# Patient Record
Sex: Male | Born: 1935 | Race: White | Hispanic: No | Marital: Married | Smoking: Never smoker
Health system: Southern US, Community
[De-identification: ages and names within clinical notes are randomized; demographics above are authoritative.]

## PROBLEM LIST (undated history)

## (undated) DIAGNOSIS — I441 Atrioventricular block, second degree: Secondary | ICD-10-CM

## (undated) DIAGNOSIS — I5030 Unspecified diastolic (congestive) heart failure: Secondary | ICD-10-CM

## (undated) DIAGNOSIS — I714 Abdominal aortic aneurysm, without rupture, unspecified: Secondary | ICD-10-CM

## (undated) DIAGNOSIS — I4891 Unspecified atrial fibrillation: Secondary | ICD-10-CM

## (undated) DIAGNOSIS — I2510 Atherosclerotic heart disease of native coronary artery without angina pectoris: Secondary | ICD-10-CM

## (undated) DIAGNOSIS — I10 Essential (primary) hypertension: Secondary | ICD-10-CM

## (undated) DIAGNOSIS — G459 Transient cerebral ischemic attack, unspecified: Secondary | ICD-10-CM

## (undated) DIAGNOSIS — E785 Hyperlipidemia, unspecified: Secondary | ICD-10-CM

## (undated) HISTORY — DX: Essential (primary) hypertension: I10

## (undated) HISTORY — DX: Unspecified diastolic (congestive) heart failure: I50.30

## (undated) HISTORY — DX: Unspecified atrial fibrillation: I48.91

## (undated) HISTORY — DX: Hyperlipidemia, unspecified: E78.5

## (undated) HISTORY — DX: Atrioventricular block, second degree: I44.1

## (undated) HISTORY — DX: Abdominal aortic aneurysm, without rupture: I71.4

## (undated) HISTORY — DX: Atherosclerotic heart disease of native coronary artery without angina pectoris: I25.10

## (undated) HISTORY — DX: Abdominal aortic aneurysm, without rupture, unspecified: I71.40

## (undated) HISTORY — DX: Transient cerebral ischemic attack, unspecified: G45.9

## (undated) HISTORY — PX: PROSTATECTOMY: SHX69

---

## 1975-01-31 HISTORY — PX: CORONARY ARTERY BYPASS GRAFT: SHX141

## 2000-04-05 ENCOUNTER — Ambulatory Visit (HOSPITAL_COMMUNITY): Admission: RE | Admit: 2000-04-05 | Discharge: 2000-04-05 | Payer: Self-pay | Admitting: Cardiology

## 2000-04-05 ENCOUNTER — Encounter: Payer: Self-pay | Admitting: Cardiology

## 2004-04-19 ENCOUNTER — Ambulatory Visit: Payer: Self-pay | Admitting: *Deleted

## 2004-10-05 ENCOUNTER — Ambulatory Visit: Payer: Self-pay | Admitting: *Deleted

## 2004-11-14 ENCOUNTER — Ambulatory Visit: Payer: Self-pay

## 2005-04-04 ENCOUNTER — Ambulatory Visit: Payer: Self-pay | Admitting: *Deleted

## 2005-05-17 ENCOUNTER — Ambulatory Visit: Payer: Self-pay | Admitting: *Deleted

## 2005-05-25 ENCOUNTER — Ambulatory Visit: Payer: Self-pay | Admitting: *Deleted

## 2005-06-21 ENCOUNTER — Ambulatory Visit: Payer: Self-pay | Admitting: *Deleted

## 2005-11-16 ENCOUNTER — Ambulatory Visit: Payer: Self-pay

## 2005-12-12 ENCOUNTER — Ambulatory Visit: Payer: Self-pay | Admitting: *Deleted

## 2006-06-13 ENCOUNTER — Ambulatory Visit: Payer: Self-pay | Admitting: *Deleted

## 2006-10-23 ENCOUNTER — Ambulatory Visit: Payer: Self-pay | Admitting: Cardiology

## 2006-10-25 ENCOUNTER — Ambulatory Visit: Payer: Self-pay | Admitting: Cardiology

## 2006-11-14 ENCOUNTER — Ambulatory Visit: Payer: Self-pay

## 2007-04-04 ENCOUNTER — Ambulatory Visit: Payer: Self-pay | Admitting: Cardiology

## 2007-04-15 ENCOUNTER — Encounter: Payer: Self-pay | Admitting: Cardiology

## 2007-04-15 ENCOUNTER — Ambulatory Visit: Payer: Self-pay

## 2007-05-16 ENCOUNTER — Ambulatory Visit: Payer: Self-pay | Admitting: Cardiology

## 2007-05-16 LAB — CONVERTED CEMR LAB
ALT: 28 units/L (ref 0–53)
AST: 27 units/L (ref 0–37)
Albumin: 3.6 g/dL (ref 3.5–5.2)
Alkaline Phosphatase: 59 units/L (ref 39–117)
BUN: 15 mg/dL (ref 6–23)
Basophils Absolute: 0 10*3/uL (ref 0.0–0.1)
Basophils Relative: 0.2 % (ref 0.0–1.0)
Bilirubin, Direct: 0.1 mg/dL (ref 0.0–0.3)
CO2: 32 meq/L (ref 19–32)
Calcium: 9.9 mg/dL (ref 8.4–10.5)
Chloride: 97 meq/L (ref 96–112)
Cholesterol: 96 mg/dL (ref 0–200)
Creatinine, Ser: 0.8 mg/dL (ref 0.4–1.5)
Eosinophils Absolute: 0.2 10*3/uL (ref 0.0–0.7)
Eosinophils Relative: 2.7 % (ref 0.0–5.0)
GFR calc Af Amer: 119 mL/min
GFR calc non Af Amer: 99 mL/min
Glucose, Bld: 84 mg/dL (ref 70–99)
HCT: 36.9 % — ABNORMAL LOW (ref 39.0–52.0)
HDL: 37 mg/dL — ABNORMAL LOW (ref >39.0)
Hemoglobin: 12.3 g/dL — ABNORMAL LOW (ref 13.0–17.0)
LDL Cholesterol: 49 mg/dL (ref 0–99)
Lymphocytes Relative: 12.7 % (ref 12.0–46.0)
MCHC: 33.2 g/dL (ref 30.0–36.0)
MCV: 88.4 fL (ref 78.0–100.0)
Monocytes Absolute: 0.9 10*3/uL (ref 0.1–1.0)
Monocytes Relative: 10.6 % (ref 3.0–12.0)
Neutro Abs: 6.2 10*3/uL (ref 1.4–7.7)
Neutrophils Relative %: 73.8 % (ref 43.0–77.0)
Platelets: 398 10*3/uL (ref 150–400)
Potassium: 4 meq/L (ref 3.5–5.1)
RBC: 4.17 M/uL — ABNORMAL LOW (ref 4.22–5.81)
RDW: 13.3 % (ref 11.5–14.6)
Sodium: 132 meq/L — ABNORMAL LOW (ref 135–145)
TSH: 1.34 microintl units/mL (ref 0.35–5.50)
Total Bilirubin: 1.2 mg/dL (ref 0.3–1.2)
Total CHOL/HDL Ratio: 2.6
Total Protein: 6.1 g/dL (ref 6.0–8.3)
Triglycerides: 49 mg/dL (ref 0–149)
VLDL: 10 mg/dL (ref 0–40)
WBC: 8.4 10*3/uL (ref 4.5–10.5)

## 2007-10-31 ENCOUNTER — Ambulatory Visit: Payer: Self-pay | Admitting: Cardiology

## 2007-11-05 ENCOUNTER — Ambulatory Visit: Payer: Self-pay | Admitting: Cardiology

## 2007-11-05 ENCOUNTER — Ambulatory Visit: Payer: Self-pay

## 2007-11-05 LAB — CONVERTED CEMR LAB
ALT: 32 units/L (ref 0–53)
AST: 26 units/L (ref 0–37)
Albumin: 3.7 g/dL (ref 3.5–5.2)
Alkaline Phosphatase: 80 units/L (ref 39–117)
BUN: 11 mg/dL (ref 6–23)
Bilirubin, Direct: 0.3 mg/dL (ref 0.0–0.3)
CO2: 30 meq/L (ref 19–32)
Calcium: 9.4 mg/dL (ref 8.4–10.5)
Chloride: 96 meq/L (ref 96–112)
Cholesterol: 106 mg/dL (ref 0–200)
Creatinine, Ser: 0.8 mg/dL (ref 0.4–1.5)
GFR calc Af Amer: 119 mL/min
GFR calc non Af Amer: 99 mL/min
Glucose, Bld: 93 mg/dL (ref 70–99)
HDL: 43.7 mg/dL (ref >39.0)
LDL Cholesterol: 55 mg/dL (ref 0–99)
Potassium: 4.2 meq/L (ref 3.5–5.1)
Sodium: 133 meq/L — ABNORMAL LOW (ref 135–145)
Total Bilirubin: 1.3 mg/dL — ABNORMAL HIGH (ref 0.3–1.2)
Total CHOL/HDL Ratio: 2.4
Total Protein: 6.1 g/dL (ref 6.0–8.3)
Triglycerides: 39 mg/dL (ref 0–149)
VLDL: 8 mg/dL (ref 0–40)

## 2008-03-10 ENCOUNTER — Ambulatory Visit: Payer: Self-pay

## 2008-03-10 ENCOUNTER — Ambulatory Visit: Payer: Self-pay | Admitting: Cardiology

## 2008-03-10 ENCOUNTER — Encounter: Payer: Self-pay | Admitting: Cardiology

## 2008-03-10 LAB — CONVERTED CEMR LAB
ALT: 24 units/L (ref 0–53)
AST: 26 units/L (ref 0–37)
Albumin: 3.7 g/dL (ref 3.5–5.2)
Alkaline Phosphatase: 87 units/L (ref 39–117)
BUN: 14 mg/dL (ref 6–23)
Bilirubin, Direct: 0.1 mg/dL (ref 0.0–0.3)
CO2: 34 meq/L — ABNORMAL HIGH (ref 19–32)
Calcium: 9.5 mg/dL (ref 8.4–10.5)
Chloride: 97 meq/L (ref 96–112)
Cholesterol: 106 mg/dL (ref 0–200)
Creatinine, Ser: 0.7 mg/dL (ref 0.4–1.5)
GFR calc Af Amer: 139 mL/min
GFR calc non Af Amer: 115 mL/min
Glucose, Bld: 52 mg/dL — ABNORMAL LOW (ref 70–99)
HDL: 38.5 mg/dL — ABNORMAL LOW (ref >39.0)
LDL Cholesterol: 53 mg/dL (ref 0–99)
Potassium: 3.4 meq/L — ABNORMAL LOW (ref 3.5–5.1)
Pro B Natriuretic peptide (BNP): 305 pg/mL — ABNORMAL HIGH (ref 0.0–100.0)
Sodium: 136 meq/L (ref 135–145)
Total Bilirubin: 1.5 mg/dL — ABNORMAL HIGH (ref 0.3–1.2)
Total CHOL/HDL Ratio: 2.8
Total Protein: 6.6 g/dL (ref 6.0–8.3)
Triglycerides: 71 mg/dL (ref 0–149)
VLDL: 14 mg/dL (ref 0–40)

## 2008-03-30 ENCOUNTER — Ambulatory Visit: Payer: Self-pay | Admitting: Cardiology

## 2008-05-09 DIAGNOSIS — I714 Abdominal aortic aneurysm, without rupture: Secondary | ICD-10-CM

## 2008-05-09 DIAGNOSIS — I4891 Unspecified atrial fibrillation: Secondary | ICD-10-CM

## 2008-05-09 DIAGNOSIS — E7800 Pure hypercholesterolemia, unspecified: Secondary | ICD-10-CM

## 2008-05-09 DIAGNOSIS — I5032 Chronic diastolic (congestive) heart failure: Secondary | ICD-10-CM

## 2008-05-09 DIAGNOSIS — I2510 Atherosclerotic heart disease of native coronary artery without angina pectoris: Secondary | ICD-10-CM

## 2008-05-09 DIAGNOSIS — I10 Essential (primary) hypertension: Secondary | ICD-10-CM

## 2008-05-12 ENCOUNTER — Encounter: Payer: Self-pay | Admitting: Cardiology

## 2008-05-12 ENCOUNTER — Ambulatory Visit: Payer: Self-pay | Admitting: Cardiology

## 2008-05-12 LAB — CONVERTED CEMR LAB
BUN: 17 mg/dL (ref 6–23)
CO2: 32 meq/L (ref 19–32)
Calcium: 9.4 mg/dL (ref 8.4–10.5)
Chloride: 107 meq/L (ref 96–112)
Creatinine, Ser: 0.7 mg/dL (ref 0.4–1.5)
GFR calc non Af Amer: 114.63 mL/min (ref >60)
Glucose, Bld: 72 mg/dL (ref 70–99)
Potassium: 4 meq/L (ref 3.5–5.1)
Pro B Natriuretic peptide (BNP): 621 pg/mL — ABNORMAL HIGH (ref 0.0–100.0)
Sodium: 141 meq/L (ref 135–145)

## 2008-07-01 ENCOUNTER — Encounter: Payer: Self-pay | Admitting: Cardiology

## 2008-07-14 ENCOUNTER — Ambulatory Visit: Payer: Self-pay | Admitting: Cardiology

## 2008-10-26 ENCOUNTER — Encounter (INDEPENDENT_AMBULATORY_CARE_PROVIDER_SITE_OTHER): Payer: Self-pay | Admitting: *Deleted

## 2008-10-29 ENCOUNTER — Telehealth: Payer: Self-pay | Admitting: Cardiology

## 2008-11-11 ENCOUNTER — Ambulatory Visit: Payer: Self-pay | Admitting: Cardiology

## 2008-11-11 ENCOUNTER — Ambulatory Visit: Payer: Self-pay

## 2008-11-11 LAB — CONVERTED CEMR LAB
BUN: 28 mg/dL — ABNORMAL HIGH (ref 6–23)
CO2: 29 meq/L (ref 19–32)
Calcium: 9.9 mg/dL (ref 8.4–10.5)
Chloride: 100 meq/L (ref 96–112)
Creatinine, Ser: 0.9 mg/dL (ref 0.4–1.5)
GFR calc non Af Amer: 85.67 mL/min (ref >60)
Glucose, Bld: 92 mg/dL (ref 70–99)
Potassium: 4.8 meq/L (ref 3.5–5.1)
Sodium: 138 meq/L (ref 135–145)

## 2008-12-28 ENCOUNTER — Ambulatory Visit: Payer: Self-pay | Admitting: Cardiology

## 2008-12-28 DIAGNOSIS — J069 Acute upper respiratory infection, unspecified: Secondary | ICD-10-CM | POA: Insufficient documentation

## 2009-07-19 ENCOUNTER — Ambulatory Visit: Payer: Self-pay | Admitting: Cardiology

## 2009-08-09 ENCOUNTER — Encounter: Payer: Self-pay | Admitting: Cardiology

## 2009-08-23 ENCOUNTER — Telehealth: Payer: Self-pay | Admitting: Cardiology

## 2009-11-10 ENCOUNTER — Encounter: Payer: Self-pay | Admitting: Cardiology

## 2009-11-11 ENCOUNTER — Encounter: Payer: Self-pay | Admitting: Cardiology

## 2009-11-11 ENCOUNTER — Ambulatory Visit: Payer: Self-pay

## 2010-01-11 ENCOUNTER — Ambulatory Visit: Payer: Self-pay | Admitting: Cardiology

## 2010-01-11 ENCOUNTER — Encounter: Payer: Self-pay | Admitting: Cardiology

## 2010-03-03 NOTE — Assessment & Plan Note (Signed)
Summary: F6M/DM   Primary Provider:  dr. Maisie Fus in [place]  CC:  check up.  History of Present Illness: Philip Flores is a pleasant gentleman with history of coronary artery disease, status post coronary artery bypassing graft, small abdominal aortic aneurysm, and atrial fibrillation.  His most recent catheterization was in March of 2002.  This followed an abnormal Myoview which showed an ejection fraction of 53% and moderate-to-severe ischemia in the inferior and lateral walls from the apex to the base.  His catheterization at that time showed total occlusion of his native vessels.  The left internal mammary artery to the left anterior descending was patent, and the distal left anterior descending filled well.  The distal left anterior descending also filled a marginal branch and a posterior descending artery.  His ejection fraction was 50%.  Note, the saphenous vein graft to the  posterior descending artery and posterolateral was completely occluded, and the saphenous vein graft to the circumflex was completely occluded.  He has been treated medically. He also has previous Wenckebach on previous electrocardiogram. He has also had problems with volume excess in the past treated with Lasix.  An echocardiogram performed on March 10, 2008 revealed an  ejection fraction of 50-55%.  There was mild-to- moderate left atrial enlargement.  There was mild tricuspid regurgitation.  It was a technically difficult study. Abdominal ultrasound in October of 2011 revealed a 3.5 x 3.5 cm aneurysm and followup was recommended in one year.  I last saw him in June of 2011. Since then the patient denies any dyspnea on exertion, orthopnea, PND, pedal edema, palpitations, syncope or chest pain.  Current Medications (verified): 1)  Plavix 75 Mg Tabs (Clopidogrel Bisulfate) .... Take One Tablet By Mouth Daily 2)  Furosemide 20 Mg Tabs (Furosemide) .... Take One Tablet By Mouth Daily. 3)  Simvastatin 80 Mg Tabs (Simvastatin)  .... Take One Half Tablet By Mouth Daily At Bedtime 4)  Aspirin 81 Mg Tbec (Aspirin) .... Take One Tablet By Mouth Daily 5)  Citalopram Hydrobromide 40 Mg Tabs (Citalopram Hydrobromide) .... Take One Tablet By Mouth Daily 6)  Vitamin B-12 500 Mcg Tabs (Cyanocobalamin) .Marland Kitchen.. 1 Tab By Mouth Once Daily 7)  Lisinopril 40 Mg Tabs (Lisinopril) .... Take One Tablet By Mouth Daily 8)  Tylenol Ex St Arthritis Pain 500 Mg Tabs (Acetaminophen) .... As Needed 9)  Artificial Tears 1.4 % Soln (Polyvinyl Alcohol) .... As Directed 10)  Cetirizine Hcl 10 Mg Tabs (Cetirizine Hcl) .Marland Kitchen.. 1 Tab By Mouth Once Daily 11)  Zetia 10 Mg Tabs (Ezetimibe) .... Take One Tablet By Mouth Daily. 12)  Spironolactone 25 Mg Tabs (Spironolactone) .... Take One Tablet By Mouth Daily  Allergies: No Known Drug Allergies  Past History:  Past Medical History: Reviewed history from 12/28/2008 and no changes required. Hypertension Coronary artery disease Hyperlipidemia Abdominal aortic aneurysm H/O TIA Atrial fibrillation H/O Mobitz 1 second degree AV block diastolic congestive heart failure  Past Surgical History: Reviewed history from 05/09/2008 and no changes required. CABG (1977 with redo 1988) Prostatectomy  Social History: Reviewed history from 05/12/2008 and no changes required. Tobacco Use - No.  Alcohol Use - no Married   Review of Systems       no fevers or chills, productive cough, hemoptysis, dysphasia, odynophagia, melena, hematochezia, dysuria, hematuria, rash, seizure activity, orthopnea, PND, pedal edema, claudication. Remaining systems are negative.   Vital Signs:  Patient profile:   77 year old male Height:      74 inches Weight:  188 pounds BMI:     24.23 Pulse rate:   49 / minute Resp:     14 per minute BP sitting:   128 / 65  (right arm)  Vitals Entered By: Kem Parkinson (January 11, 2010 9:47 AM)  Physical Exam  General:  Well-developed well-nourished in no acute distress.    Skin is warm and dry.  HEENT is normal.  Neck is supple. No thyromegaly.  Chest is clear to auscultation with normal expansion.  Cardiovascular exam is irregular and bradycardic. Abdominal exam nontender or distended. No masses palpated. Extremities show no edema. neuro grossly intact    EKG  Procedure date:  01/11/2010  Findings:      Atrial fibrillation at a rate of 47.  Impression & Recommendations:  Problem # 1:  ABDOMINAL AORTIC ANEURYSM (ICD-441.4) Followup abdominal ultrasound October 2012.  Problem # 2:  ATRIAL FIBRILLATION (ICD-427.31) Continue aspirin and Plavix. Not a Coumadin candidate given history of falls. Rate controlled on no medications. His updated medication list for this problem includes:    Plavix 75 Mg Tabs (Clopidogrel bisulfate) .Marland Kitchen... Take one tablet by mouth daily    Aspirin 81 Mg Tbec (Aspirin) .Marland Kitchen... Take one tablet by mouth daily  Problem # 3:  ESSENTIAL HYPERTENSION, BENIGN (ICD-401.1) Blood pressure controlled on present medications. Will continue. Potassium and renal function monitored by primary care. The following medications were removed from the medication list:    Benicar 20 Mg Tabs (Olmesartan medoxomil) .Marland Kitchen... Take one tablet by mouth daily His updated medication list for this problem includes:    Furosemide 20 Mg Tabs (Furosemide) .Marland Kitchen... Take one tablet by mouth daily.    Aspirin 81 Mg Tbec (Aspirin) .Marland Kitchen... Take one tablet by mouth daily    Lisinopril 40 Mg Tabs (Lisinopril) .Marland Kitchen... Take one tablet by mouth daily    Spironolactone 25 Mg Tabs (Spironolactone) .Marland Kitchen... Take one tablet by mouth daily  Problem # 4:  HYPERCHOLESTEROLEMIA-PURE (ICD-272.0) Continue present medications. Lipids and liver monitored by primary care. His updated medication list for this problem includes:    Simvastatin 80 Mg Tabs (Simvastatin) .Marland Kitchen... Take one half tablet by mouth daily at bedtime    Zetia 10 Mg Tabs (Ezetimibe) .Marland Kitchen... Take one tablet by mouth  daily.  Problem # 5:  CORONARY ATHEROSCLEROSIS NATIVE CORONARY ARTERY (ICD-414.01) Continue aspirin, Plavix, ACE inhibitor and statin. Patient declines myoview. His updated medication list for this problem includes:    Plavix 75 Mg Tabs (Clopidogrel bisulfate) .Marland Kitchen... Take one tablet by mouth daily    Aspirin 81 Mg Tbec (Aspirin) .Marland Kitchen... Take one tablet by mouth daily    Lisinopril 40 Mg Tabs (Lisinopril) .Marland Kitchen... Take one tablet by mouth daily  Problem # 6:  CONGESTIVE HEART FAILURE UNSPECIFIED (ICD-428.0) Continue present medications. Euvolemic on examination. The following medications were removed from the medication list:    Benicar 20 Mg Tabs (Olmesartan medoxomil) .Marland Kitchen... Take one tablet by mouth daily His updated medication list for this problem includes:    Plavix 75 Mg Tabs (Clopidogrel bisulfate) .Marland Kitchen... Take one tablet by mouth daily    Furosemide 20 Mg Tabs (Furosemide) .Marland Kitchen... Take one tablet by mouth daily.    Aspirin 81 Mg Tbec (Aspirin) .Marland Kitchen... Take one tablet by mouth daily    Lisinopril 40 Mg Tabs (Lisinopril) .Marland Kitchen... Take one tablet by mouth daily    Spironolactone 25 Mg Tabs (Spironolactone) .Marland Kitchen... Take one tablet by mouth daily  Patient Instructions: 1)  Your physician wants you to follow-up in: 6  MONTHS  You will receive a reminder letter in the mail two months in advance. If you don't receive a letter, please call our office to schedule the follow-up appointment.

## 2010-03-03 NOTE — Progress Notes (Signed)
Summary: Questions about meds  Phone Note Call from Patient Call back at Home Phone [phone]   Caller: Patient Summary of Call: Pt request call quwestions about medications Initial call taken by: Judie Grieve,  August 23, 2009 9:33 AM  Follow-up for Phone Call        spoke with pt wife, he was seen by dr Jens Som on 07/19/09 and after reviewing his med list spironolactone 25mg  once daily was not given to [place]. she wants to make sure he needs to cont both that and furosemide. will foward for dr Jens Som review. Deliah Goody, RN  August 23, 2009 11:31 AM   Additional Follow-up for Phone Call Additional follow up Details #1::        continue both Ferman Hamming, MD, [facility]  August 23, 2009 2:46 PM  pt wife aware Deliah Goody, RN  August 23, 2009 3:21 PM

## 2010-03-03 NOTE — Miscellaneous (Signed)
Summary: Orders Update  Clinical Lists Changes  Orders: Added new Test order of Abdominal Aorta Duplex (Abd Aorta Duplex) - Signed 

## 2010-03-03 NOTE — Assessment & Plan Note (Signed)
Summary: 6 month rov/sl   Primary Provider:  dr. Maisie Fus in [place]  CC:  check up.  History of Present Illness: Philip Flores is a pleasant gentleman with history of coronary artery disease, status post coronary artery bypassing graft, small abdominal aortic aneurysm, and atrial fibrillation.  His most recent catheterization was in March of 2002.  This followed an abnormal Myoview which showed an ejection fraction of 53% and moderate-to-severe ischemia in the inferior and lateral walls from the apex to the base.  His catheterization at that time showed total occlusion of his native vessels.  The left internal mammary artery to the left anterior descending was patent, and the distal left anterior descending filled well.  The distal left anterior descending also filled a marginal branch and a posterior descending artery.  His ejection fraction was 50%.  Note, the saphenous vein graft to the  posterior descending artery and posterolateral was completely occluded, and the saphenous vein graft to the circumflex was completely occluded.  He has been treated medically.He also has previous Wenckebach on previous electrocardiogram. He has also had problems with volume excess in the past treated with Lasix.  An echocardiogram performed on March 10, 2008 revealed an  ejection fraction of 50-55%.  There was mild-to- moderate left atrial enlargement.  There was mild tricuspid regurgitation.  It was a technically difficult study. Abdominal ultrasound in October of 2010 revealed a 3.5 x 3.4 cm aneurysm and followup was recommended in one year.  I last saw him in Nov 2010. Since then the patient denies any dyspnea on exertion, orthopnea, PND, pedal edema, palpitations, syncope or chest pain.   Current Medications (verified): 1)  Plavix 75 Mg Tabs (Clopidogrel Bisulfate) .... Take One Tablet By Mouth Daily 2)  Benicar 20 Mg Tabs (Olmesartan Medoxomil) .... Take One Tablet By Mouth Daily 3)  Furosemide 20 Mg Tabs  (Furosemide) .... Take One Tablet By Mouth Daily. 4)  Simvastatin 80 Mg Tabs (Simvastatin) .... Take One Half Tablet By Mouth Daily At Bedtime 5)  Aspirin 81 Mg Tbec (Aspirin) .... Take One Tablet By Mouth Daily 6)  Zyrtec Allergy 10 Mg Tabs (Cetirizine Hcl) .... As Needed 7)  Citalopram Hydrobromide 40 Mg Tabs (Citalopram Hydrobromide) .... Take One Tablet By Mouth Daily 8)  Vitamin B-12 500 Mcg Tabs (Cyanocobalamin) .Marland Kitchen.. 1 Tab By Mouth Once Daily 9)  Potassium Chloride Crys Cr 20 Meq Cr-Tabs (Potassium Chloride Crys Cr) .... 1/2 Tab Two Times A Day 10)  Lisinopril 40 Mg Tabs (Lisinopril) .... Take One Tablet By Mouth Daily 11)  Tylenol Ex St Arthritis Pain 500 Mg Tabs (Acetaminophen) .... As Needed 12)  Artificial Tears 1.4 % Soln (Polyvinyl Alcohol) .... As Directed 13)  Cetirizine Hcl 10 Mg Tabs (Cetirizine Hcl) .Marland Kitchen.. 1 Tab By Mouth Once Daily  Allergies: No Known Drug Allergies  Past History:  Past Medical History: Reviewed history from 12/28/2008 and no changes required. Hypertension Coronary artery disease Hyperlipidemia Abdominal aortic aneurysm H/O TIA Atrial fibrillation H/O Mobitz 1 second degree AV block diastolic congestive heart failure  Past Surgical History: Reviewed history from 05/09/2008 and no changes required. CABG (1977 with redo 1988) Prostatectomy  Social History: Reviewed history from 05/12/2008 and no changes required. Tobacco Use - No.  Alcohol Use - no Married   Review of Systems       no fevers or chills, productive cough, hemoptysis, dysphasia, odynophagia, melena, hematochezia, dysuria, hematuria, rash, seizure activity, orthopnea, PND, pedal edema, claudication. Remaining systems are negative.  Vital Signs:  Patient profile:   77 year old male Height:      74 inches Weight:      184 pounds BMI:     23.71 Pulse rate:   51 / minute Resp:     12 per minute BP sitting:   115 / 70  (left arm)  Vitals Entered By: Kem Parkinson (July 19, 2009 9:49 AM)  Physical Exam  General:  Well-developed well-nourished in no acute distress.  Skin is warm and dry.  HEENT is normal.  Neck is supple. No thyromegaly.  Chest is clear to auscultation with normal expansion.  Cardiovascular exam is irregular.  Abdominal exam nontender or distended. No masses palpated. Extremities show no edema. neuro grossly intact    EKG  Procedure date:  07/19/2009  Findings:      Atrial fibrillation at a rate of 51. Axis normal. Minor nonspecific ST changes.  Impression & Recommendations:  Problem # 1:  ABDOMINAL AORTIC ANEURYSM (ICD-441.4) Followup abdominal ultrasound in October of 2011.  Problem # 2:  ATRIAL FIBRILLATION (ICD-427.31) Rate controlled on no medications. No Coumadin given history of recurrent falls. Continue aspirin and Plavix. His updated medication list for this problem includes:    Plavix 75 Mg Tabs (Clopidogrel bisulfate) .Marland Kitchen... Take one tablet by mouth daily    Aspirin 81 Mg Tbec (Aspirin) .Marland Kitchen... Take one tablet by mouth daily  Problem # 3:  ESSENTIAL HYPERTENSION, BENIGN (ICD-401.1) Blood pressure controlled on present medications. Will continue. Renal function and potassium monitor at the [place]. His updated medication list for this problem includes:    Benicar 20 Mg Tabs (Olmesartan medoxomil) .Marland Kitchen... Take one tablet by mouth daily    Furosemide 20 Mg Tabs (Furosemide) .Marland Kitchen... Take one tablet by mouth daily.    Aspirin 81 Mg Tbec (Aspirin) .Marland Kitchen... Take one tablet by mouth daily    Lisinopril 40 Mg Tabs (Lisinopril) .Marland Kitchen... Take one tablet by mouth daily  Problem # 4:  HYPERCHOLESTEROLEMIA-PURE (ICD-272.0) Continue present medications. Lipids and liver monitored at the [place]. The following medications were removed from the medication list:    Zetia 10 Mg Tabs (Ezetimibe) .Marland Kitchen... Take one tablet by mouth daily. His updated medication list for this problem includes:    Simvastatin 80 Mg Tabs (Simvastatin) .Marland Kitchen... Take one half  tablet by mouth daily at bedtime  Problem # 5:  CORONARY ATHEROSCLEROSIS NATIVE CORONARY ARTERY (ICD-414.01) Continue present medications including aspirin, ACE inhibitor and statin. We discussed a repeat Myoview but he would like to avoid this. Also his previous Myoview showed significant ischemia but followup catheterization suggested medical therapy only. His updated medication list for this problem includes:    Plavix 75 Mg Tabs (Clopidogrel bisulfate) .Marland Kitchen... Take one tablet by mouth daily    Aspirin 81 Mg Tbec (Aspirin) .Marland Kitchen... Take one tablet by mouth daily    Lisinopril 40 Mg Tabs (Lisinopril) .Marland Kitchen... Take one tablet by mouth daily  Problem # 6:  CONGESTIVE HEART FAILURE UNSPECIFIED (ICD-428.0) Euvolemic on examination. Continue present dose of diuretics. His updated medication list for this problem includes:    Plavix 75 Mg Tabs (Clopidogrel bisulfate) .Marland Kitchen... Take one tablet by mouth daily    Benicar 20 Mg Tabs (Olmesartan medoxomil) .Marland Kitchen... Take one tablet by mouth daily    Furosemide 20 Mg Tabs (Furosemide) .Marland Kitchen... Take one tablet by mouth daily.    Aspirin 81 Mg Tbec (Aspirin) .Marland Kitchen... Take one tablet by mouth daily    Lisinopril 40 Mg Tabs (Lisinopril) .Marland Kitchen... Take  one tablet by mouth daily  Patient Instructions: 1)  Your physician recommends that you schedule a follow-up appointment in: 6 MONTHS

## 2010-04-25 ENCOUNTER — Encounter: Payer: Self-pay | Admitting: Physician Assistant

## 2010-05-06 ENCOUNTER — Encounter: Payer: Self-pay | Admitting: Physician Assistant

## 2010-05-11 ENCOUNTER — Encounter: Payer: Self-pay | Admitting: Physician Assistant

## 2010-05-11 ENCOUNTER — Ambulatory Visit (INDEPENDENT_AMBULATORY_CARE_PROVIDER_SITE_OTHER): Payer: Medicare Other | Admitting: Physician Assistant

## 2010-05-11 VITALS — BP 129/66 | HR 53 | Ht 73.5 in | Wt 187.0 lb

## 2010-05-11 DIAGNOSIS — R0989 Other specified symptoms and signs involving the circulatory and respiratory systems: Secondary | ICD-10-CM

## 2010-05-11 DIAGNOSIS — R0609 Other forms of dyspnea: Secondary | ICD-10-CM

## 2010-05-11 DIAGNOSIS — I509 Heart failure, unspecified: Secondary | ICD-10-CM

## 2010-05-11 DIAGNOSIS — R5383 Other fatigue: Secondary | ICD-10-CM

## 2010-05-11 DIAGNOSIS — I5032 Chronic diastolic (congestive) heart failure: Secondary | ICD-10-CM

## 2010-05-11 DIAGNOSIS — I2510 Atherosclerotic heart disease of native coronary artery without angina pectoris: Secondary | ICD-10-CM

## 2010-05-11 DIAGNOSIS — I4891 Unspecified atrial fibrillation: Secondary | ICD-10-CM

## 2010-05-11 DIAGNOSIS — R5381 Other malaise: Secondary | ICD-10-CM

## 2010-05-11 NOTE — Assessment & Plan Note (Signed)
He denies any symptoms of angina.  Continue ASA and Plavix.  If he has recurrent symptoms and, otherwise, negative workup, we may need to consider functional testing (if he will agree).

## 2010-05-11 NOTE — Assessment & Plan Note (Addendum)
Etiology unclear.  His BNP was apparently elevated.  I do not have a copy of those results.  However currently he feels back to baseline.  With his history of diastolic heart failure, I will go ahead and obtain a repeat BNP as well as a basic metabolic panel.  Overall, his weight has been stable.  On exam, his volume appears stable.  I would have a high threshold to increase his Lasix.  Given his fatigue and significant coronary disease I will go ahead and repeat an echocardiogram to reassess his LV function.  He also has a history of type 1 second degree block and currently has chronic atrial fibrillation with a slow ventricular response.  He denies any history of syncope or near syncope.  However, I am concerned he may be at risk for significant sinus node dysfunction.  Therefore, I will set him up with a 48 hour Holter monitor to rule out significant bradycardia.  He will be brought back in follow up with Dr. Jens Som in the next 3-4 weeks.

## 2010-05-11 NOTE — Patient Instructions (Addendum)
Your physician recommends that you return for lab work in: TODAY BMET/BNP 428.32   Your physician has requested that you have an echocardiogram 780.79, 428.32. Echocardiography is a painless test that uses sound waves to create images of your heart. It provides your doctor with information about the size and shape of your heart and how well your heart's chambers and valves are working. This procedure takes approximately one hour. There are no restrictions for this procedure.  Your physician has recommended that you wear a 48 HOUR holter monitor 780.79, 428.32. Holter monitors are medical devices that record the heart's electrical activity. Doctors most often use these monitors to diagnose arrhythmias. Arrhythmias are problems with the speed or rhythm of the heartbeat. The monitor is a small, portable device. You can wear one while you do your normal daily activities. This is usually used to diagnose what is causing palpitations/syncope (passing out).

## 2010-05-11 NOTE — Assessment & Plan Note (Signed)
Slow ventricular response.  No coumadin due to h/o falls.  Get monitor to r/o significant bradycardia/pauses.

## 2010-05-11 NOTE — Progress Notes (Signed)
History of Present Illness: Primary Cardiologist: Dr. Lanae Crumbly Sr. is a 77 y.o. male with a history of coronary artery disease, status post coronary artery bypassing graft, small abdominal aortic aneurysm, and atrial fibrillation.  His most recent catheterization was in March of 2002.  This followed an abnormal Myoview which showed an ejection fraction of 53% and moderate-to-severe ischemia in the inferior and lateral walls from the apex to the base.  His catheterization at that time showed total occlusion of his native vessels.  The left internal mammary artery to the left anterior descending was patent, and the distal left anterior descending filled well.  The distal left anterior descending also filled a marginal branch and a posterior descending artery.  His ejection fraction was 50%.  Note, the saphenous vein graft to the  posterior descending artery and posterolateral was completely occluded, and the saphenous vein graft to the circumflex was completely occluded.  He has been treated medically. He also has previous Wenckebach on previous electrocardiogram. He has also had problems with volume excess in the past treated with Lasix.  An echocardiogram performed on March 10, 2008 revealed an  ejection fraction of 50-55%.  There was mild-to- moderate left atrial enlargement.  There was mild tricuspid regurgitation.  It was a technically difficult study. Abdominal ultrasound in October of 2011 revealed a 3.5 x 3.5 cm aneurysm and followup was recommended in one year.   He presents today at the request of his PCP.  He was seen 2 weeks ago with complaints of fatigue.  A comprehensive metabolic panel, CBC, TSH and BNP were all obtained.  I received a copy of the CMET which demonstrated a normal potassium at 4.7 and creatinine of 0.7.  Comments indicate that his BNP was elevated.  However, I do not have those results.  Apparently his hemoglobin was also low.  The patient really felt  exhausted with just about any type of work for about 2 weeks.  He feels better now.  He denies any chest discomfort.  He denies syncope or near-syncope.  He does have some chronic dyspnea with exertion.  He describes probable NYHA class II symptoms.  He denies orthopnea or PND.  He denies significant pedal edema.  As noted, since he saw his PCP, he has felt much better and actually feels back to baseline.  Past Medical History  Diagnosis Date  . Hypertension   . Coronary artery disease     a. s/p redo CABG 1988; cath 3/02: LAD, CFX and RCA occluded; S-PDA + PLBr of RCA + dCFX occluded, S-CFX occluded, L-LAD ok - treated medically  . Hyperlipidemia   . Abdominal aortic aneurysm     u/s 10/11: 3.5 x 3.5 cm  . TIA (transient ischemic attack)     hx of  . Atrial fibrillation     no coumadin 2/2 falls  . Mobitz type 1 second degree AV block   . Diastolic congestive heart failure     echo 2/10: Ef 50-55%, in AK, sept HK, mild Asc aorta dilataion, mild-mod BAE, mild LVH    Current Outpatient Prescriptions  Medication Sig Dispense Refill  . Acetaminophen (TYLENOL ARTHRITIS EXT RELIEF PO) Take by mouth as needed.        Marland Kitchen aspirin 81 MG tablet Take 81 mg by mouth 2 (two) times daily.       . Calcium Carbonate-Vitamin D (CALCIUM + D) 600-200 MG-UNIT TABS Take 1 tablet by mouth.        Marland Kitchen  cetirizine (ZYRTEC) 10 MG tablet Take 10 mg by mouth daily.        . citalopram (CELEXA) 40 MG tablet Take 40 mg by mouth daily.        . clopidogrel (PLAVIX) 75 MG tablet Take 75 mg by mouth daily.        Marland Kitchen ezetimibe (ZETIA) 10 MG tablet Take 10 mg by mouth daily.        . furosemide (LASIX) 20 MG tablet Take 20 mg by mouth daily.        Marland Kitchen lisinopril (PRINIVIL,ZESTRIL) 40 MG tablet Take 40 mg by mouth daily.        . polyvinyl alcohol (LIQUIFILM TEARS) 1.4 % ophthalmic solution As directed       . simvastatin (ZOCOR) 80 MG tablet Take 40 mg by mouth at bedtime.       Marland Kitchen spironolactone (ALDACTONE) 25 MG tablet  Take 12.5 mg by mouth daily.       . vitamin B-12 (CYANOCOBALAMIN) 500 MCG tablet Take 500 mcg by mouth daily.          No Known Allergies  History  Substance Use Topics  . Smoking status: Never Smoker   . Smokeless tobacco: Current User  . Alcohol Use: 0.6 oz/week    1 Glasses of wine per week    ROS:  Please see the history of present illness.  He denies fevers, chills.  He does have a chronic cough.  He denies melena or hematochezia.  All other systems reviewed and negative.  Vital Signs: BP 129/66  Pulse 53  Ht 6' 1.5" (1.867 m)  Wt 187 lb (84.823 kg)  BMI 24.34 kg/m2  PHYSICAL EXAM: Well nourished, well developed, in no acute distress HEENT: normal Neck: no JVD At 45 Endocrine: No thyromegaly Vascular: No carotid bruits Cardiac:  normal S1, S2; Irregularly irregular rhythm; no murmur Lungs:  clear to auscultation bilaterally, no wheezing, rhonchi or rales Abd: soft, nontender, no hepatomegaly Ext: Trace bilateral edema Skin: warm and dry Neuro:  CNs 2-12 intact, no focal abnormalities noted Psych: Normal affect  EKG:  Afib, HR 53, PVC, NSSTTW changes.  ASSESSMENT AND PLAN:

## 2010-05-11 NOTE — Assessment & Plan Note (Signed)
Check bnp and adjust lasix if needed.  Check repeat echo.

## 2010-05-12 LAB — BASIC METABOLIC PANEL
BUN: 24 mg/dL — ABNORMAL HIGH (ref 6–23)
CO2: 26 mEq/L (ref 19–32)
Calcium: 9.6 mg/dL (ref 8.4–10.5)
Creatinine, Ser: 0.8 mg/dL (ref 0.4–1.5)
GFR: 102.19 mL/min (ref >60.00)
Glucose, Bld: 118 mg/dL — ABNORMAL HIGH (ref 70–99)

## 2010-05-14 ENCOUNTER — Telehealth: Payer: Self-pay | Admitting: *Deleted

## 2010-05-14 DIAGNOSIS — R5381 Other malaise: Secondary | ICD-10-CM

## 2010-05-14 DIAGNOSIS — I5032 Chronic diastolic (congestive) heart failure: Secondary | ICD-10-CM

## 2010-05-14 DIAGNOSIS — I509 Heart failure, unspecified: Secondary | ICD-10-CM

## 2010-05-14 DIAGNOSIS — R5383 Other fatigue: Secondary | ICD-10-CM

## 2010-05-14 NOTE — Telephone Encounter (Signed)
Lab orders put in for repeat bmet/bnp 05/30/10

## 2010-05-14 NOTE — Telephone Encounter (Signed)
See phone note

## 2010-05-20 ENCOUNTER — Encounter (INDEPENDENT_AMBULATORY_CARE_PROVIDER_SITE_OTHER): Payer: Medicare Other

## 2010-05-20 ENCOUNTER — Ambulatory Visit (HOSPITAL_COMMUNITY): Payer: Medicare Other | Attending: Cardiology | Admitting: Radiology

## 2010-05-20 DIAGNOSIS — I4891 Unspecified atrial fibrillation: Secondary | ICD-10-CM

## 2010-05-20 DIAGNOSIS — I5032 Chronic diastolic (congestive) heart failure: Secondary | ICD-10-CM

## 2010-05-20 DIAGNOSIS — I080 Rheumatic disorders of both mitral and aortic valves: Secondary | ICD-10-CM | POA: Insufficient documentation

## 2010-05-20 DIAGNOSIS — I379 Nonrheumatic pulmonary valve disorder, unspecified: Secondary | ICD-10-CM | POA: Insufficient documentation

## 2010-05-20 DIAGNOSIS — I509 Heart failure, unspecified: Secondary | ICD-10-CM

## 2010-05-20 DIAGNOSIS — I079 Rheumatic tricuspid valve disease, unspecified: Secondary | ICD-10-CM | POA: Insufficient documentation

## 2010-05-20 DIAGNOSIS — I714 Abdominal aortic aneurysm, without rupture, unspecified: Secondary | ICD-10-CM | POA: Insufficient documentation

## 2010-05-20 DIAGNOSIS — I2510 Atherosclerotic heart disease of native coronary artery without angina pectoris: Secondary | ICD-10-CM | POA: Insufficient documentation

## 2010-05-20 DIAGNOSIS — R5383 Other fatigue: Secondary | ICD-10-CM

## 2010-05-22 ENCOUNTER — Encounter: Payer: Self-pay | Admitting: Physician Assistant

## 2010-05-30 ENCOUNTER — Other Ambulatory Visit (INDEPENDENT_AMBULATORY_CARE_PROVIDER_SITE_OTHER): Payer: Medicare Other | Admitting: *Deleted

## 2010-05-30 DIAGNOSIS — I509 Heart failure, unspecified: Secondary | ICD-10-CM

## 2010-05-30 DIAGNOSIS — R5383 Other fatigue: Secondary | ICD-10-CM

## 2010-05-30 DIAGNOSIS — I5032 Chronic diastolic (congestive) heart failure: Secondary | ICD-10-CM

## 2010-05-30 DIAGNOSIS — R0989 Other specified symptoms and signs involving the circulatory and respiratory systems: Secondary | ICD-10-CM

## 2010-05-30 DIAGNOSIS — R0609 Other forms of dyspnea: Secondary | ICD-10-CM

## 2010-05-30 DIAGNOSIS — R5381 Other malaise: Secondary | ICD-10-CM

## 2010-05-30 LAB — BASIC METABOLIC PANEL
GFR: 91.17 mL/min (ref >60.00)
Glucose, Bld: 148 mg/dL — ABNORMAL HIGH (ref 70–99)
Potassium: 3.6 mEq/L (ref 3.5–5.1)
Sodium: 137 mEq/L (ref 135–145)

## 2010-06-02 ENCOUNTER — Encounter: Payer: Self-pay | Admitting: Cardiology

## 2010-06-07 ENCOUNTER — Encounter: Payer: Self-pay | Admitting: Cardiology

## 2010-06-07 ENCOUNTER — Ambulatory Visit (INDEPENDENT_AMBULATORY_CARE_PROVIDER_SITE_OTHER): Payer: Medicare Other | Admitting: Cardiology

## 2010-06-07 DIAGNOSIS — I509 Heart failure, unspecified: Secondary | ICD-10-CM

## 2010-06-07 DIAGNOSIS — I5032 Chronic diastolic (congestive) heart failure: Secondary | ICD-10-CM

## 2010-06-07 DIAGNOSIS — I10 Essential (primary) hypertension: Secondary | ICD-10-CM

## 2010-06-07 DIAGNOSIS — E7800 Pure hypercholesterolemia, unspecified: Secondary | ICD-10-CM

## 2010-06-07 DIAGNOSIS — I4891 Unspecified atrial fibrillation: Secondary | ICD-10-CM

## 2010-06-07 DIAGNOSIS — I714 Abdominal aortic aneurysm, without rupture: Secondary | ICD-10-CM

## 2010-06-07 DIAGNOSIS — I2510 Atherosclerotic heart disease of native coronary artery without angina pectoris: Secondary | ICD-10-CM

## 2010-06-07 NOTE — Assessment & Plan Note (Signed)
Continue statin. 

## 2010-06-07 NOTE — Patient Instructions (Signed)
Your physician recommends that you schedule a follow-up appointment in:  6 MONTHS WITH DR CRENSHAW   Your physician recommends that you continue on your current medications as directed. Please refer to the Current Medication list given to you today. 

## 2010-06-07 NOTE — Assessment & Plan Note (Signed)
Followup abdominal ultrasound October 2012.

## 2010-06-07 NOTE — Assessment & Plan Note (Signed)
Euvolemic on examination. Continue present dose of diuretics. 

## 2010-06-07 NOTE — Assessment & Plan Note (Signed)
Continue aspirin and statin. 

## 2010-06-07 NOTE — Assessment & Plan Note (Signed)
Blood pressure controlled with present medications. Will continue. 

## 2010-06-07 NOTE — Progress Notes (Signed)
HPI: Mr. Speir is a pleasant gentleman with history of coronary artery disease, status post coronary artery bypassing graft, small abdominal aortic aneurysm, and atrial fibrillation.  His most recent catheterization was in March of 2002.  This followed an abnormal Myoview which showed an ejection fraction of 53% and moderate-to-severe ischemia in the inferior and lateral walls from the apex to the base.  His catheterization at that time showed total occlusion of his native vessels.  The left internal mammary artery to the left anterior descending was patent, and the distal left anterior descending filled well.  The distal left anterior descending also filled a marginal branch and a posterior descending artery.  His ejection fraction was 50%.  Note, the saphenous vein graft to the  posterior descending artery and posterolateral was completely occluded, and the saphenous vein graft to the circumflex was completely occluded.  He has been treated medically. He also has previous Wenckebach on previous electrocardiogram. He has also had problems with volume excess in the past treated with Lasix.  Abdominal ultrasound in October of 2011 revealed a 3.5 x 3.5 cm aneurysm and followup was recommended in one year. Recently seen for complaints of fatigue. Echocardiogram  In April of 2012showed normal LV function, moderate biatrial enlargement, mild right ventricular enlargement, mild mitral regurgitation. Holter monitor showed atrial fibrillation with bradycardia. However the episodes of bradycardia typically occurred in the early morning hours. Since he was seen previously, the patient denies any dyspnea on exertion, orthopnea, PND, pedal edema, palpitations, syncope or chest pain. His fatigue has resolved. No history of presyncope.    Current Outpatient Prescriptions  Medication Sig Dispense Refill  . aspirin 81 MG tablet Take 81 mg by mouth daily.       . Calcium Carbonate-Vitamin D (CALCIUM + D) 600-200 MG-UNIT TABS  Take 1 tablet by mouth.        . cetirizine (ZYRTEC) 10 MG tablet Take 10 mg by mouth daily.        . citalopram (CELEXA) 40 MG tablet Take 40 mg by mouth daily.        . clopidogrel (PLAVIX) 75 MG tablet Take 75 mg by mouth daily.        Marland Kitchen ezetimibe (ZETIA) 10 MG tablet Take 10 mg by mouth daily.        . furosemide (LASIX) 20 MG tablet Take 20 mg by mouth daily.        Marland Kitchen lisinopril (PRINIVIL,ZESTRIL) 40 MG tablet Take 40 mg by mouth daily.        . polyvinyl alcohol (LIQUIFILM TEARS) 1.4 % ophthalmic solution As directed       . simvastatin (ZOCOR) 80 MG tablet Take 40 mg by mouth at bedtime.       Marland Kitchen spironolactone (ALDACTONE) 25 MG tablet Take 12.5 mg by mouth daily.       . vitamin B-12 (CYANOCOBALAMIN) 500 MCG tablet Take 500 mcg by mouth daily.        . Acetaminophen (TYLENOL ARTHRITIS EXT RELIEF PO) Take by mouth as needed.           Past Medical History  Diagnosis Date  . Hypertension   . Coronary artery disease     a. s/p redo CABG 1988; cath 3/02: LAD, CFX and RCA occluded; S-PDA + PLBr of RCA + dCFX occluded, S-CFX occluded, L-LAD ok - treated medically  . Hyperlipidemia   . Abdominal aortic aneurysm     u/s 10/11: 3.5 x 3.5 cm  . TIA (  transient ischemic attack)     hx of  . Atrial fibrillation     no coumadin 2/2 falls  . Mobitz type 1 second degree AV block   . Diastolic congestive heart failure     echo 2/10: Ef 50-55%, in AK, sept HK, mild Asc aorta dilataion, mild-mod BAE, mild LVH;   b. echo 4/12:  EF 60-65%, mild LVH, mod BAE, mild MR, PASP 45    Past Surgical History  Procedure Date  . Coronary artery bypass graft 1977    with redo 1988  . Prostatectomy     History   Social History  . Marital Status: Married    Spouse Name: N/A    Number of Children: N/A  . Years of Education: N/A   Occupational History  . Not on file.   Social History Main Topics  . Smoking status: Never Smoker   . Smokeless tobacco: Current User  . Alcohol Use: 0.6 oz/week     1 Glasses of wine per week  . Drug Use: No  . Sexually Active: Not on file   Other Topics Concern  . Not on file   Social History Narrative  . No narrative on file    ROS: no fevers or chills, productive cough, hemoptysis, dysphasia, odynophagia, melena, hematochezia, dysuria, hematuria, rash, seizure activity, orthopnea, PND, pedal edema, claudication. Remaining systems are negative.  Physical Exam: Well-developed well-nourished in no acute distress.  Skin is warm and dry.  HEENT is normal.  Neck is supple. No thyromegaly.  Chest is clear to auscultation with normal expansion.  Cardiovascular exam is irregular Abdominal exam nontender or distended. No masses palpated. Extremities show no edema. neuro grossly intact

## 2010-06-07 NOTE — Assessment & Plan Note (Signed)
Continue aspirin and Plavix. Not a Coumadin candidate given history of falls. Rate is controlled on no medications. Note recent Holter monitor showed bradycardia but predominantly when patient asleep. His fatigue has resolved. No further therapy at this time.

## 2010-06-14 NOTE — Assessment & Plan Note (Signed)
[facility]                            CARDIOLOGY OFFICE NOTE   Philip Flores, Philip Flores                        MRN:          [id number]  DATE:03/10/2008                            DOB:          24-Jan-1924    Philip Flores is a pleasant gentleman with a history of coronary artery  disease, status post coronary bypassing graft, small abdominal aortic  aneurysm, and atrial fibrillation.  His LV function has been preserved  previously.  Since I last saw him, he apparently has developed mild  dyspnea on exertion, orthopnea, and increased pedal edema.  There has  been no chest pain, palpitations, or syncope.  He was seen by Dr. Maisie Fus  and placed on Lasix and his symptoms have dramatically improved.  He now  falls back to normal.  He lost approximately 10 pounds.   PRESENT MEDICATIONS:  1. Zocor 80 mg p.o. daily.  2. Plavix 75 mg p.o. daily.  3. Cetirizine 10 mg p.o. daily.  4. Zetia 10 mg p.o. daily.  5. Aspirin 81 mg tablets 2 p.o. daily.  6. Citalopram.  7. Vitamin B12.  8. Lisinopril 40 mg p.o. daily.  9. Lasix 20 mg p.o. b.i.d.   PHYSICAL EXAMINATION:  VITAL SIGNS:  Blood pressure of 161/71.  His  pulse is 60.  He weighs 187 pounds.  HEENT:  Normal.  NECK:  Supple.  CHEST:  Clear.  CARDIOVASCULAR:  Irregular rhythm.  ABDOMEN:  No tenderness.  EXTREMITIES:  Trace edema.  There was mild erythema on the left lower  extremity.   Electrocardiogram shows atrial fibrillation at a rate of 60.  The axis  is normal.  There are nonspecific ST changes.   DIAGNOSES:  1. Edema/volume excess - Philip Flores appears to have developed some      degree of heart failure, which may be either related to systolic      failure or diastolic.  We will schedule him to have an      echocardiogram to requantify his left ventricular function.  He      does state that he is up most of the night urinating.  I have      decreased his Lasix to 20 mg p.o. daily.  If he starts to  gain      weight or become short of breath again, we will increase to 40 mg      p.o. daily.  I will check a BMET today to follow his potassium,      renal function as well as a BNP.  We will see him back in 6 weeks      to make sure that he is stable.  2. Atrial fibrillation - his rate is controlled with medications.  He      will continue with his aspirin and Plavix.  He is not a Coumadin      candidate, as he has had multiple falls in the past.  3. History of small abdominal aortic aneurysm - the patient had an      ultrasound performed in October 2009, that showed 3.2 x  3.1-cm      aneurysm.  He will need to follow up in October of this year.  4. Hypertension - his blood pressure is elevated today.  I have added      Norvasc 5 mg p.o. daily to assist with this.  5. Hyperlipidemia - He will continue on his statin.  6. History of Wenckebach on previous electrocardiogram.  7. History of transient ischemic attack.     Philip Flores Philip Som, MD, [facility]  Electronically Signed    BSC/MedQ  DD: 03/10/2008  DT: 03/10/2008  Job #: [id number]   cc:   Philip Flores

## 2010-06-14 NOTE — Assessment & Plan Note (Signed)
[facility]                            CARDIOLOGY OFFICE NOTE   [name], [name]                        MRN:          [id number]  DATE:04/08/2007                            DOB:          1923/06/06    This note is concerning Mr. Philip Flores.  I recently saw him in the  office and on routine follow-up electrocardiogram, he was found to be  either in coarse atrial fibrillation versus atrial flutter.  Note, he  was not having symptoms.  We did note at that time that he had had  several falls in the past year and felt he was not a Coumadin candidate.  However, if this was an ablatable atrial flutter, we felt that a short-  term course of Coumadin and ablation would be appropriate to reduce his  risk of thromboembolic event.  I did review his electrocardiogram with  Dr. Ladona Ridgel today.  He feels that it may be a flutter, but if so, may be  difficult to ablate and would have a high potential of recurrence.  Given that he is asymptomatic, he would recommend rate control.  Again  we feel that he is not a good candidate for Coumadin given his history  of falls.  We have therefore treated with an aspirin.     Madolyn Frieze Jens Som, MD, [facility]  Electronically Signed    BSC/MedQ  DD: 04/08/2007  DT: 04/09/2007  Job #: [id number]

## 2010-06-14 NOTE — Letter (Signed)
Jun 13, 2006    Konrad Felix, M.D.  [facility] [address] [facility]  [address], [address]  [place], [place] [address]   RE:  [employer], [employer]  MRN:  [id number]  /  DOB:  1924-01-12   Dear Nida Boatman:   It was pleasure to see this nice patient, Philip Flores, for follow-up  on Jun 13, 2006.   As you know, he is a very pleasant 77 year old white married male with a  long history of coronary artery disease, initially seen by me in 1977.  He had severe coronary artery disease at that time and underwent CABG at  [facility] by Dr. __________ .   Subsequently he had redo CABG in 1988 by Dr. Particia Lather.  A cardiac  catheterization in February 2002 revealed total occlusion of the native  vessels and total effusion saphenous vein graft to the posterior  descending and posterolateral branch to the RCA and distal circumflex.  The only patent graft was the left internal mammary artery anastomosed  to the LAD, this was functioning normally.  There were good  collateralizations to the OM and distal RCA.  EF was 50%.  Patient  remains asymptomatic and very active.  Most recent stress test February  2002 revealed markedly positive EKG and moderately severe redistribution  inferior lateral walls.  This had not changed from the year previously.   He has had no symptoms and had not had follow-up study.  A 2-D echo in  February 2003, revealed EF of 55-60%.   MEDICATIONS:  1. Simvastatin 80.  2. Lisinopril 40.  3. Plavix 75.  4. Cetirizine 10 mg daily.  5. Zetia 10.  6. Aspirin 162 mg daily.  7. Hydrochlorothiazide 12.5.  8. B12 daily.   PHYSICAL EXAMINATION:  GENERAL APPEARANCE:  Normal.  VITAL SIGNS:  Blood pressure 140/76, pulse 73 in normal sinus rhythm.  NECK:  JVP is not elevated.  Carotid pulses palpable and equal without  bruits.  LUNGS:  Clear.  CARDIOVASCULAR:  Normal.  ABDOMEN:  Unremarkable.  I should note that he does have a known  abdominal  aneurysm most recently performed November 16, 2005, measuring  3.2 x 3.2 cm.  EXTREMITIES:  Normal.   EKG reveals normal sinus rhythm, first degree AV block, minor  nonspecific ST changes.   I should note that the lipids have been well controlled, most recently  noted in November 2007.   IMPRESSION:  1. Coronary artery disease, 20 years post redo coronary artery bypass      grafting by Dr. Particia Lather with follow-up catheterization      February 2002, revealing only the left internal mammary artery      patent.  2. Abdominal aneurysm.  3. Hyperlipidemia, treated.  4. Hypertension, treated.   We plan to continue on the same therapy.  He should have follow-up  lipids and renal profile in your office and I will have him return here  in three or four months to see Dr. Olga Millers for follow-up as I  will be retiring in June.   It would probably be good to repeat echocardiogram or Myoview following  that.   Thank you for the opportunity to share in this very pleasant gentleman's  care.  Certainly I hope he will continue to get along well.    Sincerely,      E. Graceann Congress, MD, [facility]    EJL/MedQ  DD: 06/13/2006  DT: 06/13/2006  Job #: [id number]

## 2010-06-14 NOTE — Assessment & Plan Note (Signed)
[place] [facility]                            CARDIOLOGY OFFICE NOTE   FAIZON, CAPOZZI                        MRN:          [id number]  DATE:10/31/2007                            DOB:          02-15-1923    Mr. Chahal is a pleasant gentleman who has a history of coronary artery  disease status post coronary artery bypassing graft, history of small  abdominal aortic aneurysm and atrial fibrillation.  He also has a  history of previous Wenckebach on previous electrocardiogram.  Since I  last saw him, he is doing well from symptomatic standpoint.  He denies  any increased dyspnea, chest pain, palpitations or syncope.  There is no  pedal edema.  He still is unsteady on his feet at times and has fallen  in the past.   MEDICATIONS:  1. Zocor 80 mg p.o. daily.  2. Plavix 75 mg p.o. daily.  3. Cetirizine 10 mg p.o. daily.  4. Zetia 10 mg p.o. daily.  5. Aspirin 81 mg p.o. daily.  6. Citalopram 40 mg p.o. daily.  7. Hydrochlorothiazide 12.5 mg p.o. daily.  8. Vitamin B12.  9. Lisinopril 40 mg p.o. daily.   PHYSICAL EXAMINATION:  VITAL SIGNS:  Blood pressure of 158/73 and his  pulse is 52.  He weighs 189 pounds.  HEENT:  Normal.  NECK:  Supple.  CHEST:  Clear.  CARDIOVASCULAR:  Bradycardic rate.  The rhythm appears to be regular.  ABDOMEN:  No tenderness.  EXTREMITIES:  No edema.   His electrocardiogram shows probable atrial fibrillation with slow  ventricular response at 51.  The axis is normal.  There are nonspecific  ST changes.   DIAGNOSES:  1. Atrial fibrillation - The patient remains in atrial fibrillation      today.  His rate is not elevated and there is no atrioventricular      nodal blockade agents needed.  We will continue with his aspirin      and Plavix.  He is not a Coumadin candidate as he has been unsteady      on his feet and had multiple falls in the past.  2. History of small abdominal aortic aneurysm - He needs a followup  ultrasound, we will arrange this.  3. Hypertension - His blood pressure is mildly elevated today.      However, he states it typically runs in the 130/80 range.  We will      make no changes and he will continue to track this at home.  I will      check a BMET to follow his potassium and renal function.  4. Hyperlipidemia - We will check lipids and liver and adjust as      indicated.  5. History of Wenckebach on previous electrocardiogram.  6. History of transient ischemic attack.   He will see me back in 6 months.     Madolyn Frieze Jens Som, MD, [facility]  Electronically Signed    BSC/MedQ  DD: 10/31/2007  DT: 11/01/2007  Job #: [id number]   cc:   Konrad Felix

## 2010-06-14 NOTE — Assessment & Plan Note (Signed)
[facility]                            CARDIOLOGY OFFICE NOTE   KAISEN, ACKERS                        MRN:          [id number]  DATE:03/30/2008                            DOB:          02/23/1923    Mr. Droke is a pleasant gentleman with history of coronary artery  disease, status post coronary artery bypassing graft, small abdominal  aortic aneurysm, and atrial fibrillation.  He also has previous  Wenckebach on previous electrocardiogram.  I last saw him on March 10, 2008.  At that time, he developed recent volume excess and Lasix had  been added.  We did decrease his Lasix at that time to 20 mg p.o. daily.  We scheduled him to have an echocardiogram, which was performed on  March 10, 2008.  His ejection fraction was 50-55%.  There was mild-to-  moderate left atrial enlargement.  There was mild tricuspid  regurgitation.  It was a technically difficult study.  Note, we also  checked laboratories and his BNP was mildly elevated at 305.  During  that evaluation, his blood pressure was also elevated and I added  Norvasc 5 mg p.o. daily.  However, apparently he developed worsening  pedal edema.  He was seen by Marshell Garfinkel, who works with Dr. Maisie Fus.  The  Norvasc was discontinued as he felt that maybe contributing to his  edema.  Instead, he added Benicar 20 mg p.o. daily.  Since then, he  denies any dyspnea, chest pain, palpitations, or syncope.  However, he  is complaining of pedal edema.  There is also some pruritus by his  report.   PRESENT MEDICATIONS:  1. Zocor 80 mg p.o. daily.  2. Plavix 75 mg p.o. daily.  3. Cetirizine 10 mg p.o. daily.  4. Zetia 10 mg p.o. daily.  5. Aspirin 81 mg tabs 2 p.o. daily.  6. Citalopram 40 mg p.o. daily.  7. Vitamin B12.  8. Lisinopril 40 mg p.o. daily.  9. Benicar 20 mg p.o. daily.  10.Lasix 20 mg p.o. daily.   PHYSICAL EXAMINATION:  VITAL SIGNS:  Blood pressure of 138/64.  His  pulse is 56.  He  weighs 189 pounds.  HEENT:  Normal.  NECK:  Supple.  CHEST:  Clear.  CARDIOVASCULAR:  Irregular rhythm.  ABDOMEN:  No tenderness.  EXTREMITIES:  1+ edema.   DIAGNOSES:  1. Diastolic congestive heart failure - the patient continues to have      some edema.  I have asked him to increase his Lasix to 40 mg p.o.      daily.  This should help both with his diuretics and also his blood      pressure.  We will check a BMET in 3 days to follow his potassium      and renal function.  We will continue off the Norvasc as certainly      it could have contributed to his edema, although I think it is more      likely could be diastolic congestive heart failure as his symptoms      have not improved  off of the Norvasc.  I will see the patient back      in 6 weeks to make sure that he is continuing to improve and not      becoming volume overloaded.  2. Atrial fibrillation - his rate is controlled on no medications.  He      will continue on his aspirin and Plavix.  He is not a Coumadin      candidate as he had multiple falls in the past.  3. History of small abdominal aortic aneurysm - the patient will need      a followup ultrasound in October 2010.  4. Hypertension - his blood pressure is improved.  We will continue      his Benicar.  We are checking a BMET in 3 days.  5. Hyperlipidemia - we will continue his Zocor.  6. History of Wenckebach on previous electrocardiogram.  7. History of transient ischemic attack.     Madolyn Frieze Jens Som, MD, [facility]  Electronically Signed    BSC/MedQ  DD: 03/30/2008  DT: 03/31/2008  Job #: [id number]   cc:   Konrad Felix

## 2010-06-14 NOTE — Assessment & Plan Note (Signed)
[facility]                            CARDIOLOGY OFFICE NOTE   AWS, SHERE                        MRN:          [id number]  DATE:05/16/2007                            DOB:          1923/10/08    Mr. Arredondo is a gentleman who has a history coronary disease, status post  coronary bypassing graft, history of small abdominal aortic aneurysm and  recently diagnosed atrial flutter.  He also has a history of Wenckebach  on his electrocardiogram.  Since I last saw him, he denies any dyspnea,  chest pain, palpitations or syncope and there is no pedal edema.  He did  fall again, stating that he lost his balance.   MEDICATIONS:  1. Zocor 80 mg p.o. daily.  2. Plavix 75 mg p.o. daily  3. Cetirizine.  4. Zetia 10 mg p.o. daily.  5. Aspirin 81 mg p.o. day.  6. Citalopram/HCTZ 12.5 mg p.o. daily.  7. Vitamin B12.  8. Lisinopril 20 mg p.o. daily.  9. Augmentin.  10.Amoxicillin.   PHYSICAL EXAMINATION:  VITAL SIGNS: Today show a blood pressure of  109/60 and his pulse is 54.  He weighs 185 pounds.  HEENT:  Normal.  NECK:  Supple.  No bruits.  CHEST:  Clear.  CARDIOVASCULAR:  Reveals a regular rate and rhythm.  ABDOMEN:  Shows no tenderness.  EXTREMITIES:  No edema.   DIAGNOSES:  1. Atrial flutter - I did review the previous electrocardiogram with      Dr. Ladona Ridgel, and he felt that this was not flutter that could be      easily ablated.  We therefore, will not pursue this further.  We      will not add atrioventricular nodal blocking agents since his rate      is controlled and he has had Wenckebach in the past.  Also note,      that he is not a Coumadin candidate as he has multiple falls and is      unsteady on his feet.  He will, therefore continue on his aspirin      and Plavix.  I have discussed this we he and his son and they      understand this.  2. History of small abdominal aortic aneurysm - he will need follow-up      ultrasound in  November.  3. Hypertension - his blood pressure is adequately controlled on his      present medications.  I will check a BMET today.  4. He is complaining of fatigue.  We will check a TSH and a CBC today.  5. Hyperlipidemia - we will check lipids and liver, and he will      continue on statin and Zetia for now.  6. History of Wenckebach on electrocardiogram - there is no syncope      and no prolonged pauses on his monitor.  Will not pursue this      further at this point.  7. History of transient ischemic attack.   We will see him back in 6 months.     Arlys John  S. Jens Som, MD, [facility]  Electronically Signed    BSC/MedQ  DD: 05/16/2007  DT: 05/16/2007  Job #: [id number]   cc:   Konrad Felix

## 2010-06-14 NOTE — Assessment & Plan Note (Signed)
[facility]                            CARDIOLOGY OFFICE NOTE   THAYER, EMBLETON                        MRN:          [id number]  DATE:10/23/2006                            DOB:          [birth year]    Mr. Philip Flores is a very pleasant gentleman who has previously been followed  by Dr. Glennon Hamilton.  The patient underwent coronary artery bypass and  graft in 1977 and had redo in 1988.  His most recent catheterization was  in March of 2002.  This followed an abnormal Myoview which showed an  ejection fraction of 53% and moderate-to-severe ischemia in the inferior  and lateral walls from the apex to the base.  His catheterization at  that time showed total occlusion of his native vessels.  The left  internal mammary artery to the left anterior descending was patent, and  the distal left anterior descending filled well.  The distal left  anterior descending also filled a marginal branch and a posterior  descending artery.  His ejection fraction was 50%.  Note, the saphenous  vein graft to the  posterior descending artery and posterolateral was  completely occluded, and the saphenous vein graft to the circumflex was  completely occluded.  He has been treated medically.  He also has a  small abdominal aortic aneurysm, hypertension, and hyperlipidemia.  He  has also had a history of prostate cancer, and has had a previous  prostatectomy.  Since Dr. Gabriel Rung last saw him he is doing well.  He denies  any dyspnea on exertion, orthopnea, PND, pedal edema, palpitations,  presyncope, syncope, exertional chest pain.   MEDICATIONS:  1. Zocor 80 mg p.o. daily.  2. Plavix 75 mg p.o. daily.  3. Cetirizine 10 mg p.o. daily.  4. Zetia 10 mg p.o. daily.  5. Aspirin 81 mg p.o. daily.  6. Citalopram 40 mg p.o. daily.  7. Hydrochlorothiazide 12.5 mg p.o. daily.  8. B12.  9. Lisinopril 20 mg p.o. daily.  10.Mobic.  11.Muscle relaxer.   PHYSICAL EXAMINATION:  Blood pressure  of 124/71, and his pulse is 82.  He weighs 179 pounds.  His HEENT is significant for small abrasions following a recent fall.  His neck is supple and there are no bruits noted.  His chest is clear.  His cardiovascular exam reveals a regular rate and rhythm.  His abdominal exam shows no pulsatile masses, no bruits.  His extremities show no edema.   His electrocardiogram shows a sinus rhythm with Wenckebach.  There is a  prior inferior infarct, and nonspecific ST changes are noted.   DIAGNOSES:  1. Coronary artery disease, status post coronary artery bypass and      graft:  The patient has had no chest pain or shortness of breath.      We will plan to continue with medical therapy at this point.  He is      on aspirin, angiotensin-converting enzyme inhibitor, and statin.      Note, he is not on a beta blocker due to his conduction      abnormalities.  2.  History of a small abdominal aortic aneurysm:  We will plan to      repeat his ultrasound in October.  3. Hypertension:  His blood pressure is well controlled on his present      medications.  I will have his most recent BMET forwarded to [place] from      Dr. Maisie Fus' office.  4. Hyperlipidemia:  He will continue on a statin, and we will have his      most recent lipids and liver forwarded to [place] for our records.  Our      goal LDL would be less than 70 given his history of coronary      disease.  5. Wenckebach on electrocardiogram:  We will check a 24-hour Holter      monitor.  Note, there is no history of syncope.  6. History of TIA.   I will see him back in approximately 6 months.     Madolyn Frieze Philip Som, MD, [facility]  Electronically Signed    BSC/MedQ  DD: 10/23/2006  DT: 10/24/2006  Job #: [id number]   cc:   Konrad Felix

## 2010-06-14 NOTE — Assessment & Plan Note (Signed)
[facility]                            CARDIOLOGY OFFICE NOTE   CAMRON, MONDAY                        MRN:          [id number]  DATE:04/04/2007                            DOB:          06-17-1923    Mr. Holdren is a gentleman previously followed by Dr. Glennon Hamilton.  He has  history of coronary artery bypassing graft in 1977 and redo in 1988.  Please refer my previous note on October 23, 2006, for details of  that.  He also has a history of small abdominal aortic aneurysm,  hypertension and hyperlipidemia.  When I last saw him we did schedule a  repeat abdominal ultrasound to size his aneurysm.  It was 3.4 x 3.4 cm  and follow-up was recommended in 1 year.  He also had stable bilateral  common iliac arteries.  We also scheduled him to have a Holter monitor  as he was noted to have Wenckebach on his EKG.  He was found to have  sinus with first AV block, Wenckebach, PVCs and a rare couplet.  There  are no prolonged pauses.  Since I last saw him he denies any increased  dyspnea, chest pain, palpitations or syncope, and there is no pedal  edema.  Note, the patient has had three falls in the past year.  He  states he has merely lost his balance.  One episode occurred when he  fell in his bathroom and hit his head on the commode, requiring  stitches.   MEDICATIONS:  1. Zocor 80 mg p.o. daily.  2. Plavix 75 mg p.o. daily.  3. Cetirizine 10 mg p.o. daily.  4. Zetia 10 mg p.o. daily.  5. Aspirin 81 mg tablets two p.o. daily.  6. Citalopram 40 mg p.o. daily.  7. Hydrochlorothiazide 12.5 mg p.o. daily.  8. Vitamin B12.  9. Lisinopril 20 mg p.o. daily.   PHYSICAL EXAM:  Blood pressure of 118/60.  His pulse is 60.  He weighs  186 pounds.  HEENT:  Normal.  NECK:  Supple.  CHEST:  Clear.  CARDIOVASCULAR:  An irregular rhythm.  ABDOMEN:  No tenderness.  EXTREMITIES:  No edema.   His electrocardiogram shows probable atrial flutter at a rate of 56.  There is a prior inferior infarct.   DIAGNOSES:  1. New-onset atrial flutter.  The electrocardiogram appears to be      atrial flutter to me.  I will review this with one of our      electrophysiologists.  If this is indeed atrial flutter, then I      would recommend beginning Coumadin in anticipation of atrial      flutter ablation and short course of Coumadin.  We will check an      echocardiogram as well as a TSH.  Of note, if it appears to be      coarse atrial fibrillation, then we will most likely continue with      his present medications.  His rate is adequately controlled.  I      would be hesitant to keep him on long-term Coumadin  as he has had      three falls in the past 1 year.  I think the risk would most likely      outweigh the benefit although if he did not have falls, he would      certainly benefit given his increased embolic risk factors of      hypertension and age greater than 104.  We will see him back in 6      weeks to review the above information.  Of note, we did have long      discussions in the office concerning the risks and benefits of      Coumadin and the patient's wife and he agree that he should      continue with his aspirin and Plavix for now.  2. History of small abdominal aortic aneurysm.  We will plan to repeat      this in November 2009.  3. Hypertension.  His blood pressures adequately controlled on his      present medications.  I will have his most recent BMET forwarded to      [place] from Dr. Maurine Minister office.  4. Hyperlipidemia.  He will continue on his statin and Zetia and we      will have his most recent lipids and liver forwarded to [place] for our      records.  5. History of Wenckebach on electrocardiogram.  There were no      prolonged pauses on his recent monitor.  6. History of transient ischemic attack.   We will see him back in 6 weeks.  Once I review this with  electrophysiologists, if it indeed is an ablatable flutter, then we will   arrange that.     Madolyn Frieze Jens Som, MD, [facility]  Electronically Signed    BSC/MedQ  DD: 04/04/2007  DT: 04/04/2007  Job #: [id number]   cc:   Konrad Felix

## 2010-06-17 NOTE — Cardiovascular Report (Signed)
[facility]  Patient:    Philip Flores, Philip Flores                      MRN: [id number] Proc. Date: 04/05/00 Adm. Date:  [id number] Attending:  Lenoria Farrier CC:         Cecil Cranker, M.D. [facility]  Konrad Felix, M.D.  Cardiac Catheterization Lab   Cardiac Catheterization  CLINICAL HISTORY:  Mr. Philip Flores is 76 years old and has had to re-do bypass surgery in 1988.  He has known occlusion of his circumflex graft and the distal limb of the saphenous vein graft to the posterior descending and posterolateral branches of the right coronary artery and distal circumflex artery.  He recently had a positive Cardiolite scan but was followed medically and then developed some recurrent symptoms of neck pain, and arrangements were made for him to come in for evaluation with catheterization.  DESCRIPTION OF PROCEDURE:  The procedure was performed via the right femoral artery using an arterial sheath and 6-French preformed coronary catheters.  A front wall arterial puncture was performed.  Omnipaque contrast was used.  We used a JR4 for injection of the vein graft to the circumflex and a right bypass graft catheter for injection of the vein graft to the right coronary artery.  A LIMA catheter was used for injection of a LIMA graft.  A distal aortogram was performed to evaluate for possible aortic aneurysm.  RESULTS: 1. The left main coronary artery:  The left main coronary artery had a 40%    distal stenosis. 2. The left anterior descending artery:  The left anterior descending    artery was completely occluded in its origin. 3. The circumflex artery:  The circumflex artery gave rise to an intermedius    branch, a first marginal branch which was occluded, and a second marginal    branch, and then was completely occluded in its mid portion.  There was 80%    narrowing in the intermedius branch and 40% narrowing in the second    marginal branch. 4. The right  coronary artery:  The right coronary artery was completely    occluded proximally.  The distal right coronary artery filled via    collaterals from the proximal right coronary artery as did the distal    circumflex artery.  The last limb of the sequential vein graft to the    posterolateral and circumflex arteries was visualized, and the circumflex    and then the posterolateral branch filled.  The distal posterior descending    branch filled via collaterals from the LAD. 5. The saphenous vein graft to    the posterior descending, posterolateral branch of the right coronary    artery and distal circumflex artery was completely occluded at its origin.    The saphenous vein graft to the circumflex artery was completely occluded    at its origin. 6. The LIMA graft to the LAD was patent, and the distal LAD filled well with    no major obstruction.  The distal LAD also filled a marginal branch and a    posterior descending branch.  Left ventriculogram:  The left ventriculogram, performed in the RAO projection, showed hypokinesis of the inferobasal wall.  The overall wall motion was fairly good with an estimated ejection fraction of 50%.  Distal aortogram:  A distal aortogram was performed which showed irregularity in the distal aorta but no major aortoiliac obstruction and no significant renal  artery stenosis.  CONCLUSION:  Coronary artery disease status post re-do coronary bypass surgery in 1988.  The native circulation shows total occlusion of the LAD, total occlusion of the right coronary artery, and total occlusion of the circumflex artery after an intermedius and marginal branch.  The saphenous vein graft to the posterior descending and posterolateral branches of the right coronary artery and distal circumflex artery was completely occluded at its origin; the saphenous vein graft to the circumflex artery was completely occluded at its origin; and the LIMA graft to the LAD was patent and  functioned normally. There was inferobasal wall hypokinesis with overall good LV function.  RECOMMENDATIONS:  The patient has occluded the remaining limb of the vein graft to the right coronary artery.  He is fairly well collateralized, and the distal vessels are not great target vessels.  He also is 77 years old and has had a previous re-do bypass surgery.  I believe I would favor medical therapy since he is not very symptomatic at present.  I will review this with Dr. Corinda Gubler. DD:  04/05/00 TD:  04/05/00 Job: [id number]

## 2010-06-17 NOTE — Letter (Signed)
December 12, 2005    Konrad Felix, M.D.  [facility]  [address]  [place], [place] [address]   RE:  [name], [name]  MRN:  [id number]  /  DOB:  February 18, 1923   Dear Nida Boatman:   It was a pleasure to see your nice patient, Philip Flores, for followup on  November 13. 2007.   As you know, he is a very pleasant 77 year old white married male with a  long history of coronary artery disease initially seen by me in 1977. He had  initial CABG in 1977, re-do CABG in 1988. He has remained asymptomatic since  that time. The cardiac cath in 2002, revealed total occlusion of the native  vessels and total occlusion of the saphenous vein grafts to the post  descending and post lateral branch of the RCA and distal circ. The only  patent graft is the left mammary artery to the LAD, which functions  normally. There was collateralization to the OM, and distal RCA. EF was 50%.   The patient remains asymptomatic. He is on simvastatin 80, lisinopril 40,  Plavix 75, Certerizine 10, Zetia 10, aspirin 162, citalopram 40, HCTZ 12.5,  Vesicare 5, B12 daily.   Blood pressure is 145/80, pulse is 71, normal sinus rhythm.  GENERAL APPEARANCE: Normal. JVP is not elevated. Carotid pulse palpable  without bruits.  LUNGS:  Are clear.  CARDIAC: Normal.  ABDOMEN: Unremarkable with small incisional hernia in the left epigastrium.  EXTREMITIES: Reveal no edema.   EKG: Reveals normal sinus rhythm, first-degree AV block, PR interval of 368  and his old inferior myocardial infarction.   IMPRESSION:  Coronary artery disease with previous CABG in 1977 and 1988,  with total occlusion of the native vessels and saphenous vein grafts. The  LIMA is patent as of February 2002. He also has a stable abdominal aortic  aneurysm 3.25 x 3.2.   We plan to continue on the same therapy. I do think that the Zetia is  important as is Plavix. I will plan to see him back in 5-6 months or p.r.n.     Sincerely,      E. Graceann Congress, MD, [facility]  Electronically Signed    EJL/MedQ  DD: 12/12/2005  DT: 12/12/2005  Job #: [id number]

## 2010-08-25 ENCOUNTER — Telehealth: Payer: Self-pay | Admitting: Cardiology

## 2010-08-25 NOTE — Telephone Encounter (Signed)
Pt's wife requested pt see dr Jens Som today, feels "rotten', denies chest pain, sob, dizziness, lightheadedness, syncope, couldn't get any actual symptoms at all, wife knows dr Jens Som out

## 2010-08-25 NOTE — Telephone Encounter (Signed)
Spoke with pt wife, she states for about one week the pt has felt bad. He denies any chest pain or SOB. She had called his primary and they told her to call us. She states his back and neck are bothering him. The back pain is an old injury from the [employer]. She is aware dr Jens Som is out of the office today. She called back and dr Nida Boatman Maisie Fus is going to see him this afternoon. She will call back if needed Deliah Goody

## 2010-08-31 ENCOUNTER — Other Ambulatory Visit: Payer: Self-pay | Admitting: Cardiology

## 2010-08-31 MED ORDER — CLOPIDOGREL BISULFATE 75 MG PO TABS: 75.0000 mg | ORAL_TABLET | Freq: Every day | ORAL | Status: DC

## 2010-08-31 NOTE — Telephone Encounter (Signed)
Pt is out of medication and pharmacy has been trying to get since last Friday.

## 2010-09-05 ENCOUNTER — Other Ambulatory Visit: Payer: Self-pay | Admitting: Cardiology

## 2010-09-28 ENCOUNTER — Telehealth: Payer: Self-pay | Admitting: *Deleted

## 2010-09-28 NOTE — Telephone Encounter (Signed)
OK to dc plavix and hold ASA prior to procedure; resume ASA after. Philip Flores

## 2010-09-28 NOTE — Telephone Encounter (Signed)
Dois Davenport with dr butler's office aware Deliah Goody

## 2010-09-28 NOTE — Telephone Encounter (Signed)
Received a call from sandra with dr Charm Barges, the pt was referred to GI for a workup for iron deficient anemia. They would like cardiac clearance for the pt to stop either aspirin or plavix. They would prefer he only take one or the other. Will forward for dr Jens Som review Philip Flores

## 2010-09-29 ENCOUNTER — Telehealth: Payer: Self-pay | Admitting: Cardiology

## 2010-09-29 NOTE — Telephone Encounter (Signed)
Patients having blood pressure issues. Yesterday 94/44. Also discuss changing medication. +

## 2010-09-29 NOTE — Telephone Encounter (Signed)
Spoke with pt wife, charlie was seen yesterday by the GI doctor, they think he maybe bleeding. His bp was 94/44, the GI doctor told them to call to see if he needed to stop his bp med. They have not tracked his bp at home and the pt is having no symptoms of orthostasis. They will track his bp at home and let us know if it trends low and he is having any dizziness when standing Deliah Goody

## 2010-10-06 ENCOUNTER — Telehealth: Payer: Self-pay | Admitting: Cardiology

## 2010-10-06 NOTE — Telephone Encounter (Signed)
Pt's wife calling debra back with "[employer]" that she requested

## 2010-10-06 NOTE — Telephone Encounter (Signed)
Spoke with pt wife, I had talked with her last week about the pts bp running low. She called today to report his bp ranging from 100-138/51-67. These pressures look good and we will make no changes in his meds at this time. She will call back with problems Philip Flores

## 2010-10-07 ENCOUNTER — Encounter: Payer: Self-pay | Admitting: Cardiology

## 2010-10-10 ENCOUNTER — Ambulatory Visit (INDEPENDENT_AMBULATORY_CARE_PROVIDER_SITE_OTHER): Payer: Medicare Other | Admitting: Cardiology

## 2010-10-10 ENCOUNTER — Telehealth: Payer: Self-pay | Admitting: *Deleted

## 2010-10-10 ENCOUNTER — Encounter: Payer: Self-pay | Admitting: Cardiology

## 2010-10-10 ENCOUNTER — Telehealth: Payer: Self-pay | Admitting: Cardiology

## 2010-10-10 DIAGNOSIS — I4891 Unspecified atrial fibrillation: Secondary | ICD-10-CM

## 2010-10-10 DIAGNOSIS — I714 Abdominal aortic aneurysm, without rupture, unspecified: Secondary | ICD-10-CM

## 2010-10-10 DIAGNOSIS — I10 Essential (primary) hypertension: Secondary | ICD-10-CM

## 2010-10-10 DIAGNOSIS — E7800 Pure hypercholesterolemia, unspecified: Secondary | ICD-10-CM

## 2010-10-10 DIAGNOSIS — I5032 Chronic diastolic (congestive) heart failure: Secondary | ICD-10-CM

## 2010-10-10 DIAGNOSIS — E860 Dehydration: Secondary | ICD-10-CM

## 2010-10-10 DIAGNOSIS — I2510 Atherosclerotic heart disease of native coronary artery without angina pectoris: Secondary | ICD-10-CM

## 2010-10-10 NOTE — Assessment & Plan Note (Signed)
Continue present medications for blood pressure but discontinue diuretics.

## 2010-10-10 NOTE — Assessment & Plan Note (Signed)
Continue statin. 

## 2010-10-10 NOTE — Assessment & Plan Note (Signed)
Watch volume status off of diuretics.

## 2010-10-10 NOTE — Assessment & Plan Note (Signed)
Continue aspirin, Plavix and statin. 

## 2010-10-10 NOTE — Assessment & Plan Note (Addendum)
followup abdominal ultrasound October 2012.

## 2010-10-10 NOTE — Telephone Encounter (Signed)
Returned call spoke with misti/ grand daughter. Was seen in [place] ER, lethargic not been feeling well for 8 weeks and been seen by pcp several times. Bp range is 68/40 72/44 P50- c/o feeling lethargic and irreg HB. Misti has records from ER except TSH panel. Current bp is 94/44.

## 2010-10-10 NOTE — Patient Instructions (Signed)
HOLD LASIX HOLD ALDACTONE  BMET Lab work today  Follow up with Dr.Crenshaw in 1 week.

## 2010-10-10 NOTE — Assessment & Plan Note (Signed)
Patient recently complaining of increased fatigue. His BUN and creatinine were elevated compared to previous and I think he is most likely prerenal. He felt better with IV fluids. I will discontinue his Lasix and spironolactone. Check potassium and renal function. We may resume at lower dose in the future. We will see him back next week to see if his symptoms have improved and possibly resume lower dose.

## 2010-10-10 NOTE — Telephone Encounter (Signed)
Patient grand-daughter calling patient went to er on Friday night . Was told to call the office this am for irregular heart beat. Also be seen today if possible.

## 2010-10-10 NOTE — Telephone Encounter (Signed)
[place] er over weekend for lethargy/ irreg HB/ hypotension 94/44, 72/44 P50, er let him go home with request to see his cardiologist. Been seeing PCP for weeks but continues to feel week. App made today

## 2010-10-10 NOTE — Progress Notes (Signed)
HPI:Mr. Philip Flores is a pleasant gentleman with history of coronary artery disease, status post coronary artery bypassing graft, small abdominal aortic aneurysm, and atrial fibrillation. His most recent catheterization was in March of 2002. This followed an abnormal Myoview which showed an ejection fraction of 53% and moderate-to-severe ischemia in the inferior and lateral walls from the apex to the base. His catheterization at that time showed total occlusion of his native vessels. The left internal mammary artery to the left anterior descending was patent, and the distal left anterior descending filled well. The distal left anterior descending also filled a marginal branch and a posterior descending artery. His ejection fraction was 50%. Note, the saphenous vein graft to the posterior descending artery and posterolateral was completely occluded, and the saphenous vein graft to the circumflex was completely occluded. He has been treated medically. He also has previous Wenckebach on previous electrocardiogram. He has also had problems with volume excess in the past treated with Lasix. Abdominal ultrasound in October of 2011 revealed a 3.5 x 3.5 cm aneurysm and followup was recommended in one year. Recently seen for complaints of fatigue. Echocardiogram In April of 2012 showed normal LV function, moderate biatrial enlargement, mild right ventricular enlargement, mild mitral regurgitation. Holter monitor showed atrial fibrillation with bradycardia. However the episodes of bradycardia typically occurred in the early morning hours. I last saw him in May of 2012. Since then, the patient has been complaining of weakness for approximately 2 months. Mild dizziness with standing. No dyspnea, chest pain or syncope. He was seen at [facility] on September 7 with the above complaints. Electrocardiogram showed atrial fibrillation at a rate of 49. Hemoglobin 12.7. Chest x-ray showed no active lung disease. BUN and creatinine 50  and 1.45. Troponin negative. Pro BNP 1330 which was normal. He was given 1 L of IV fluids and his ACE inhibitor was decreased. He did feel much better.   Current Outpatient Prescriptions  Medication Sig Dispense Refill  . Acetaminophen (TYLENOL ARTHRITIS EXT RELIEF PO) Take by mouth as needed.        Marland Kitchen aspirin 81 MG tablet Take 81 mg by mouth 2 (two) times daily.       Marland Kitchen azelastine (ASTELIN) 137 MCG/SPRAY nasal spray Place 1 spray into the nose 2 (two) times daily. Use in each nostril as directed       . Calcium Carbonate-Vitamin D (CALCIUM + D) 600-200 MG-UNIT TABS Take 1 tablet by mouth.        . cetirizine (ZYRTEC) 10 MG tablet Take 10 mg by mouth daily.        . citalopram (CELEXA) 40 MG tablet Take 40 mg by mouth daily.        . clopidogrel (PLAVIX) 75 MG tablet Take 1 tablet (75 mg total) by mouth daily.  30 tablet  10  . Ferrous Sulfate (IRON) 325 (65 FE) MG TABS Take by mouth 2 (two) times daily.        . furosemide (LASIX) 20 MG tablet Take 40 mg by mouth daily.       Marland Kitchen lisinopril (PRINIVIL,ZESTRIL) 40 MG tablet Take 20 mg by mouth daily.       . Multiple Vitamins-Minerals (MULTI COMPLETE PO) Take by mouth daily.        . polyvinyl alcohol (LIQUIFILM TEARS) 1.4 % ophthalmic solution As directed       . simvastatin (ZOCOR) 80 MG tablet Take 40 mg by mouth at bedtime.       Marland Kitchen spironolactone (  ALDACTONE) 25 MG tablet Take 12.5 mg by mouth daily.       . vitamin B-12 (CYANOCOBALAMIN) 500 MCG tablet Take 500 mcg by mouth daily.        Marland Kitchen ZETIA 10 MG tablet TAKE ONE (1) TABLET EACH DAY  30 tablet  12     Past Medical History  Diagnosis Date  . Hypertension   . Coronary artery disease     a. s/p redo CABG 1988; cath 3/02: LAD, CFX and RCA occluded; S-PDA + PLBr of RCA + dCFX occluded, S-CFX occluded, L-LAD ok - treated medically  . Hyperlipidemia   . Abdominal aortic aneurysm     u/s 10/11: 3.5 x 3.5 cm  . TIA (transient ischemic attack)     hx of  . Atrial fibrillation     no  coumadin 2/2 falls  . Mobitz type 1 second degree AV block   . Diastolic congestive heart failure     echo 2/10: Ef 50-55%, in AK, sept HK, mild Asc aorta dilataion, mild-mod BAE, mild LVH;   b. echo 4/12:  EF 60-65%, mild LVH, mod BAE, mild MR, PASP 45    Past Surgical History  Procedure Date  . Coronary artery bypass graft 1977    with redo 1988  . Prostatectomy     History   Social History  . Marital Status: Married    Spouse Name: N/A    Number of Children: N/A  . Years of Education: N/A   Occupational History  . Not on file.   Social History Main Topics  . Smoking status: Never Smoker   . Smokeless tobacco: Current User  . Alcohol Use: 0.6 oz/week    1 Glasses of wine per week  . Drug Use: No  . Sexually Active: Not on file   Other Topics Concern  . Not on file   Social History Narrative  . No narrative on file    ROS: no fevers or chills, productive cough, hemoptysis, dysphasia, odynophagia, melena, hematochezia, dysuria, hematuria, rash, seizure activity, orthopnea, PND, pedal edema, claudication. Remaining systems are negative.  Physical Exam: Well-developed well-nourished in no acute distress.  Skin is warm and dry.  HEENT is normal.  Neck is supple. No thyromegaly.  Chest is clear to auscultation with normal expansion.  Cardiovascular exam is bradycardic and irregular. Abdominal exam nontender or distended. No masses palpated. Extremities show no edema. neuro grossly intact  ECG atrial fibrillation at a rate of 56. Nonspecific ST changes.

## 2010-10-10 NOTE — Assessment & Plan Note (Signed)
Continue aspirin and Plavix. Not a Coumadin candidate. Heart rate is mildly reduced but it is not clear to me this is causing symptoms. If holding his diuretics does not improve his fatigue we will reassess.

## 2010-10-11 ENCOUNTER — Other Ambulatory Visit: Payer: Self-pay | Admitting: *Deleted

## 2010-10-11 DIAGNOSIS — E875 Hyperkalemia: Secondary | ICD-10-CM

## 2010-10-11 LAB — BASIC METABOLIC PANEL
BUN: 39 mg/dL — ABNORMAL HIGH (ref 6–23)
CO2: 25 mEq/L (ref 19–32)
Chloride: 97 mEq/L (ref 96–112)
Creatinine, Ser: 1.1 mg/dL (ref 0.4–1.5)
Glucose, Bld: 63 mg/dL — ABNORMAL LOW (ref 70–99)
Potassium: 5.7 mEq/L — ABNORMAL HIGH (ref 3.5–5.1)

## 2010-10-12 ENCOUNTER — Other Ambulatory Visit (INDEPENDENT_AMBULATORY_CARE_PROVIDER_SITE_OTHER): Payer: Medicare Other | Admitting: *Deleted

## 2010-10-12 DIAGNOSIS — E875 Hyperkalemia: Secondary | ICD-10-CM

## 2010-10-12 LAB — BASIC METABOLIC PANEL
CO2: 26 mEq/L (ref 19–32)
Calcium: 10 mg/dL (ref 8.4–10.5)
Potassium: 5 mEq/L (ref 3.5–5.1)
Sodium: 135 mEq/L (ref 135–145)

## 2010-10-19 ENCOUNTER — Ambulatory Visit (INDEPENDENT_AMBULATORY_CARE_PROVIDER_SITE_OTHER): Payer: Medicare Other | Admitting: Cardiology

## 2010-10-19 ENCOUNTER — Encounter: Payer: Self-pay | Admitting: Cardiology

## 2010-10-19 DIAGNOSIS — I4891 Unspecified atrial fibrillation: Secondary | ICD-10-CM

## 2010-10-19 DIAGNOSIS — I2510 Atherosclerotic heart disease of native coronary artery without angina pectoris: Secondary | ICD-10-CM

## 2010-10-19 DIAGNOSIS — R5383 Other fatigue: Secondary | ICD-10-CM

## 2010-10-19 DIAGNOSIS — I10 Essential (primary) hypertension: Secondary | ICD-10-CM

## 2010-10-19 DIAGNOSIS — E7800 Pure hypercholesterolemia, unspecified: Secondary | ICD-10-CM

## 2010-10-19 DIAGNOSIS — I714 Abdominal aortic aneurysm, without rupture: Secondary | ICD-10-CM

## 2010-10-19 DIAGNOSIS — I5032 Chronic diastolic (congestive) heart failure: Secondary | ICD-10-CM

## 2010-10-19 LAB — BASIC METABOLIC PANEL
BUN: 20 mg/dL (ref 6–23)
CO2: 22 mEq/L (ref 19–32)
Chloride: 99 mEq/L (ref 96–112)
Potassium: 5 mEq/L (ref 3.5–5.1)

## 2010-10-19 LAB — CARDIAC PANEL
CK-MB: 5.6 ng/mL — ABNORMAL HIGH (ref 0.3–4.0)
Total CK: 43 U/L (ref 7–232)

## 2010-10-19 NOTE — Assessment & Plan Note (Addendum)
Blood pressure controlled. 

## 2010-10-19 NOTE — Assessment & Plan Note (Signed)
euvolemic on examination. I have asked him to take Lasix 40 mg daily p.r.n. Increased shortness of breath or pedal edema.

## 2010-10-19 NOTE — Assessment & Plan Note (Signed)
Continue aspirin and Plavix. Not a Coumadin candidate because of previous falls.

## 2010-10-19 NOTE — Assessment & Plan Note (Signed)
Etiology remains unclear. There was some improvement with discontinuation of diuretics. There was also concern about bradycardia contributing. He walked in the office today. His heart rate at rest was 59 and increased to 86. I do not think bradycardia is responsible for his weakness. He apparently had his thyroid studies checked in [place] and they were normal. Recent hemoglobin normal. Continue off diuretics for now.

## 2010-10-19 NOTE — Assessment & Plan Note (Addendum)
Continue statin. Given weakness, will check CK.

## 2010-10-19 NOTE — Progress Notes (Signed)
HPI:Mr. Stiff is a pleasant gentleman with history of coronary artery disease, status post coronary artery bypassing graft, small abdominal aortic aneurysm, and atrial fibrillation. His most recent catheterization was in March of 2002. This followed an abnormal Myoview which showed an ejection fraction of 53% and moderate-to-severe ischemia in the inferior and lateral walls from the apex to the base. His catheterization at that time showed total occlusion of his native vessels. The left internal mammary artery to the left anterior descending was patent, and the distal left anterior descending filled well. The distal left anterior descending also filled a marginal branch and a posterior descending artery. His ejection fraction was 50%. Note, the saphenous vein graft to the posterior descending artery and posterolateral was completely occluded, and the saphenous vein graft to the circumflex was completely occluded. He has been treated medically. He also has previous Wenckebach on previous electrocardiogram. He has also had problems with volume excess in the past treated with Lasix. Abdominal ultrasound in October of 2011 revealed a 3.5 x 3.5 cm aneurysm and followup was recommended in one year. Recently seen for complaints of fatigue. Echocardiogram In April of 2012 showed normal LV function, moderate biatrial enlargement, mild right ventricular enlargement, mild mitral regurgitation. Holter monitor showed atrial fibrillation with bradycardia. However the episodes of bradycardia typically occurred in the early morning hours. The patient was complaining of weakness for approximately 2 months when I last saw him in Sept 2012. Mild dizziness with standing. No dyspnea, chest pain or syncope. He was seen at [facility] on September 7 with the above complaints. Electrocardiogram showed atrial fibrillation at a rate of 49. Hemoglobin 12.7. Chest x-ray showed no active lung disease. BUN and creatinine 50 and 1.45.  Troponin negative. Pro BNP 1330 which was normal. He was given 1 L of IV fluids and his ACE inhibitor was decreased. He did feel much better. When I last saw him, we held his diuretics. Since then, he is mildly improved. He still has weakness. There is no dyspnea, chest pain, palpitations, syncope or pedal edema.  Current Outpatient Prescriptions  Medication Sig Dispense Refill  . Acetaminophen (TYLENOL ARTHRITIS EXT RELIEF PO) Take by mouth as needed.        Marland Kitchen aspirin 81 MG tablet 2 tabs po qd      . azelastine (ASTELIN) 137 MCG/SPRAY nasal spray Place 1 spray into the nose 2 (two) times daily. Use in each nostril as directed       . Calcium Carbonate-Vitamin D (CALCIUM + D) 600-200 MG-UNIT TABS Take 1 tablet by mouth.        . cetirizine (ZYRTEC) 10 MG tablet Take 10 mg by mouth daily.        . citalopram (CELEXA) 40 MG tablet Take 40 mg by mouth daily.        . clopidogrel (PLAVIX) 75 MG tablet Take 1 tablet (75 mg total) by mouth daily.  30 tablet  10  . Ferrous Sulfate (IRON) 325 (65 FE) MG TABS Take by mouth 2 (two) times daily.        . Multiple Vitamins-Minerals (MULTI COMPLETE PO) Take by mouth daily.        . polyvinyl alcohol (LIQUIFILM TEARS) 1.4 % ophthalmic solution As directed       . simvastatin (ZOCOR) 80 MG tablet Take 40 mg by mouth at bedtime.       . vitamin B-12 (CYANOCOBALAMIN) 500 MCG tablet Take 500 mcg by mouth daily.        Marland Kitchen  ZETIA 10 MG tablet TAKE ONE (1) TABLET EACH DAY  30 tablet  12     Past Medical History  Diagnosis Date  . Hypertension   . Coronary artery disease     a. s/p redo CABG 1988; cath 3/02: LAD, CFX and RCA occluded; S-PDA + PLBr of RCA + dCFX occluded, S-CFX occluded, L-LAD ok - treated medically  . Hyperlipidemia   . Abdominal aortic aneurysm     u/s 10/11: 3.5 x 3.5 cm  . TIA (transient ischemic attack)     hx of  . Atrial fibrillation     no coumadin 2/2 falls  . Mobitz type 1 second degree AV block   . Diastolic congestive heart  failure     echo 2/10: Ef 50-55%, in AK, sept HK, mild Asc aorta dilataion, mild-mod BAE, mild LVH;   b. echo 4/12:  EF 60-65%, mild LVH, mod BAE, mild MR, PASP 45    Past Surgical History  Procedure Date  . Coronary artery bypass graft 1977    with redo 1988  . Prostatectomy     History   Social History  . Marital Status: Married    Spouse Name: N/A    Number of Children: N/A  . Years of Education: N/A   Occupational History  . Not on file.   Social History Main Topics  . Smoking status: Never Smoker   . Smokeless tobacco: Current User    Types: Chew  . Alcohol Use: 0.6 oz/week    1 Glasses of wine per week  . Drug Use: No  . Sexually Active: Not on file   Other Topics Concern  . Not on file   Social History Narrative  . No narrative on file    ROS: complains of weakness but no fevers or chills, productive cough, hemoptysis, dysphasia, odynophagia, melena, hematochezia, dysuria, hematuria, rash, seizure activity, orthopnea, PND, pedal edema, claudication. Remaining systems are negative.  Physical Exam: Well-developed well-nourished in no acute distress.  Skin is warm and dry.  HEENT is normal.  Neck is supple. No thyromegaly.  Chest is clear to auscultation with normal expansion.  Cardiovascular exam is irregular Abdominal exam nontender or distended. No masses palpated. Extremities show no edema. neuro grossly intact

## 2010-10-19 NOTE — Assessment & Plan Note (Signed)
Continue aspirin and statin. 

## 2010-10-19 NOTE — Assessment & Plan Note (Signed)
followup abdominal ultrasound October 2012. 

## 2010-10-19 NOTE — Patient Instructions (Addendum)
Your physician recommends that you schedule a follow-up appointment in: 3 months with Dr Jens Som   Your physician recommends that you return for lab work in: BMP/CK  Take your Furosemide 40mg  as needed

## 2010-10-27 ENCOUNTER — Ambulatory Visit (INDEPENDENT_AMBULATORY_CARE_PROVIDER_SITE_OTHER): Payer: Medicare Other | Admitting: *Deleted

## 2010-10-27 DIAGNOSIS — I2510 Atherosclerotic heart disease of native coronary artery without angina pectoris: Secondary | ICD-10-CM

## 2010-10-27 DIAGNOSIS — I10 Essential (primary) hypertension: Secondary | ICD-10-CM

## 2010-10-28 LAB — BASIC METABOLIC PANEL
GFR: 85.26 mL/min (ref >60.00)
Glucose, Bld: 83 mg/dL (ref 70–99)
Potassium: 5.2 mEq/L — ABNORMAL HIGH (ref 3.5–5.1)
Sodium: 138 mEq/L (ref 135–145)

## 2010-11-16 ENCOUNTER — Other Ambulatory Visit: Payer: Self-pay | Admitting: Cardiology

## 2010-11-16 DIAGNOSIS — I714 Abdominal aortic aneurysm, without rupture: Secondary | ICD-10-CM

## 2010-11-17 ENCOUNTER — Encounter (INDEPENDENT_AMBULATORY_CARE_PROVIDER_SITE_OTHER): Payer: Medicare Other | Admitting: Cardiology

## 2010-11-17 DIAGNOSIS — I714 Abdominal aortic aneurysm, without rupture, unspecified: Secondary | ICD-10-CM

## 2010-12-23 ENCOUNTER — Telehealth: Payer: Self-pay | Admitting: Cardiology

## 2010-12-23 DIAGNOSIS — R609 Edema, unspecified: Secondary | ICD-10-CM

## 2010-12-23 DIAGNOSIS — Z79899 Other long term (current) drug therapy: Secondary | ICD-10-CM

## 2010-12-23 NOTE — Telephone Encounter (Signed)
Pt wife aware of dr Ludwig Clarks recommendations. He will have labs drawn Monday Deliah Goody

## 2010-12-23 NOTE — Telephone Encounter (Signed)
Resume lasix 40 mg daily; bmet one week Philip Flores

## 2010-12-23 NOTE — Telephone Encounter (Signed)
Spoke with pt wife, she reports for the last 2 weeks the pt has had swelling in his feet and legs. He restarted his lasix 40 mg everyother day but yesterday he took two because there seems to be no difference. He does reports SOB with exertion and working in the yard. He weighs daily and his weight is up 3-4 pounds. Wife wanted to know of anything else they need to do. Told wife he would need labs and Will forward for dr Jens Som review Deliah Goody

## 2010-12-23 NOTE — Telephone Encounter (Signed)
New problem Pt's wife has called she said his legs are swollen please call her back

## 2010-12-26 ENCOUNTER — Other Ambulatory Visit: Payer: Medicare Other | Admitting: *Deleted

## 2011-01-09 ENCOUNTER — Ambulatory Visit (INDEPENDENT_AMBULATORY_CARE_PROVIDER_SITE_OTHER): Payer: Medicare Other | Admitting: Cardiology

## 2011-01-09 ENCOUNTER — Encounter: Payer: Self-pay | Admitting: Cardiology

## 2011-01-09 VITALS — BP 130/71 | HR 68 | Resp 18 | Ht 73.0 in | Wt 190.4 lb

## 2011-01-09 DIAGNOSIS — I10 Essential (primary) hypertension: Secondary | ICD-10-CM

## 2011-01-09 LAB — BASIC METABOLIC PANEL
CO2: 31 mEq/L (ref 19–32)
Chloride: 98 mEq/L (ref 96–112)
Sodium: 137 mEq/L (ref 135–145)

## 2011-01-09 MED ORDER — FUROSEMIDE 40 MG PO TABS: 40.0000 mg | ORAL_TABLET | Freq: Two times a day (BID) | ORAL | Status: DC

## 2011-01-09 NOTE — Patient Instructions (Signed)
Your physician recommends that you return for lab work in: today  Your physician recommends that you schedule a follow-up appointment in: 3 months  

## 2011-01-09 NOTE — Assessment & Plan Note (Signed)
Patient remains in sinus rhythm on examination. Continue aspirin and Plavix. Not a Coumadin candidate given her history of falls.

## 2011-01-09 NOTE — Assessment & Plan Note (Signed)
Continue aspirin, Plavix and statin. 

## 2011-01-09 NOTE — Assessment & Plan Note (Signed)
Continue statin. 

## 2011-01-09 NOTE — Assessment & Plan Note (Signed)
Blood pressure controlled. Continue present medications. 

## 2011-01-09 NOTE — Assessment & Plan Note (Signed)
Patient with mild volume excess. This is improving with present dose of Lasix which he has resumed at 40 b.i.d. Continue present medication and check potassium and renal function. Note he had problems with dehydration and orthostasis previously. We will need to balance this with keeping him euvolemic. If his weight decreases by 2-3 pounds we will resume 40 mg daily with an additional 40 mg as needed. Patient instructed on low sodium diet and fluid restriction of 1.5 L per day.

## 2011-01-09 NOTE — Assessment & Plan Note (Signed)
followup imaging in October 2013.

## 2011-01-09 NOTE — Progress Notes (Signed)
HPI:Philip Flores is a pleasant gentleman with history of coronary artery disease, status post coronary artery bypassing graft, small abdominal aortic aneurysm, and atrial fibrillation. His most recent catheterization was in March of 2002. This followed an abnormal Myoview which showed an ejection fraction of 53% and moderate-to-severe ischemia in the inferior and lateral walls from the apex to the base. His catheterization at that time showed total occlusion of his native vessels. The left internal mammary artery to the left anterior descending was patent, and the distal left anterior descending filled well. The distal left anterior descending also filled a marginal branch and a posterior descending artery. His ejection fraction was 50%. Note, the saphenous vein graft to the posterior descending artery and posterolateral was completely occluded, and the saphenous vein graft to the circumflex was completely occluded. He has been treated medically. He also has previous Wenckebach on previous electrocardiogram. He has also had problems with volume excess in the past treated with Lasix. Abdominal ultrasound in October of 2012 revealed a 3.3 x 3.3 cm aneurysm and followup was recommended in one year. Echocardiogram In April of 2012 showed normal LV function, moderate biatrial enlargement, mild right ventricular enlargement, mild mitral regurgitation. Holter monitor showed atrial fibrillation with bradycardia. However the episodes of bradycardia typically occurred in the early morning hours. Has had problems with weakness and diuretics changes to PRN. Since I last saw him in Sept 2012, he has noticed mild increased dyspnea on exertion and also edema in his lower extremities and abdomen. No chest pain. He continues to be unsteady with ambulation with tendency to fall.   Current Outpatient Prescriptions  Medication Sig Dispense Refill  . Acetaminophen (TYLENOL ARTHRITIS EXT RELIEF PO) Take by mouth as needed.        Marland Kitchen  aspirin 81 MG tablet 2 tabs po qd      . azelastine (ASTELIN) 137 MCG/SPRAY nasal spray Place 1 spray into the nose 2 (two) times daily. Use in each nostril as directed       . cetirizine (ZYRTEC) 10 MG tablet Take 10 mg by mouth daily.        . citalopram (CELEXA) 40 MG tablet Take 40 mg by mouth daily.        . clopidogrel (PLAVIX) 75 MG tablet Take 1 tablet (75 mg total) by mouth daily.  30 tablet  10  . Ferrous Sulfate (IRON) 325 (65 FE) MG TABS Take by mouth 2 (two) times daily.        . Multiple Vitamins-Minerals (MULTI COMPLETE PO) Take by mouth daily.        . polyvinyl alcohol (LIQUIFILM TEARS) 1.4 % ophthalmic solution As directed       . simvastatin (ZOCOR) 80 MG tablet Take 40 mg by mouth at bedtime.       . vitamin B-12 (CYANOCOBALAMIN) 500 MCG tablet Take 500 mcg by mouth daily.        Marland Kitchen ZETIA 10 MG tablet TAKE ONE (1) TABLET EACH DAY  30 tablet  12  . Calcium Carbonate-Vitamin D (CALCIUM + D) 600-200 MG-UNIT TABS Take 1 tablet by mouth.           Past Medical History  Diagnosis Date  . Hypertension   . Coronary artery disease     a. s/p redo CABG 1988; cath 3/02: LAD, CFX and RCA occluded; S-PDA + PLBr of RCA + dCFX occluded, S-CFX occluded, L-LAD ok - treated medically  . Hyperlipidemia   . Abdominal aortic aneurysm  u/s 10/11: 3.5 x 3.5 cm  . TIA (transient ischemic attack)     hx of  . Atrial fibrillation     no coumadin 2/2 falls  . Mobitz type 1 second degree AV block   . Diastolic congestive heart failure     echo 2/10: Ef 50-55%, in AK, sept HK, mild Asc aorta dilataion, mild-mod BAE, mild LVH;   b. echo 4/12:  EF 60-65%, mild LVH, mod BAE, mild MR, PASP 45    Past Surgical History  Procedure Date  . Coronary artery bypass graft 1977    with redo 1988  . Prostatectomy     History   Social History  . Marital Status: Married    Spouse Name: N/A    Number of Children: N/A  . Years of Education: N/A   Occupational History  . Not on file.   Social  History Main Topics  . Smoking status: Never Smoker   . Smokeless tobacco: Current User    Types: Chew  . Alcohol Use: 0.6 oz/week    1 Glasses of wine per week  . Drug Use: No  . Sexually Active: Not on file   Other Topics Concern  . Not on file   Social History Narrative  . No narrative on file    ROS: weakness has improved and no fevers or chills, productive cough, hemoptysis, dysphasia, odynophagia, melena, hematochezia, dysuria, hematuria, rash, seizure activity, orthopnea, PND,  claudication. Remaining systems are negative.  Physical Exam: Well-developed well-nourished in no acute distress.  Skin is warm and dry.  HEENT is normal.  Neck is supple. No thyromegaly.  Chest is clear to auscultation with normal expansion.  Cardiovascular exam is regular rate and rhythm. 2/6 systolic ejection murmur Abdominal exam nontender or distended. No masses palpated. Extremities show trace edema. neuro grossly intact

## 2011-01-26 ENCOUNTER — Telehealth: Payer: Self-pay | Admitting: Cardiology

## 2011-01-26 NOTE — Telephone Encounter (Signed)
Spoke with pt wife, for the last 2 weeks pt has had swelling in both legs from the top of his socks up to his knees. She reports they feel hard and the left leg has general reddness. The pt denies SOB, his weight is up and down. He is taking his lasix 40 mg every other day. He has an appt with his PCP on Monday. Will forward for dr Jens Som review

## 2011-01-26 NOTE — Telephone Encounter (Signed)
New Msg: Pt wife calling stating that pt want to know if he can see Dr. Jens Som and/or PA for swelling in pt feet and legs. Symptoms have been present for about one week. Please return pt call to discuss further.

## 2011-01-30 NOTE — Telephone Encounter (Signed)
Take lasix 40 mg daily; bmet one week Aleisha Paone  

## 2011-01-30 NOTE — Telephone Encounter (Signed)
Spoke with pt wife, she is aware of dr Ludwig Clarks recommendations. Script mailed to pt for labs to be drawn in [employer]

## 2011-02-07 ENCOUNTER — Encounter: Payer: Self-pay | Admitting: Cardiology

## 2011-04-10 ENCOUNTER — Ambulatory Visit (INDEPENDENT_AMBULATORY_CARE_PROVIDER_SITE_OTHER): Payer: Medicare Other | Admitting: Cardiology

## 2011-04-10 ENCOUNTER — Encounter: Payer: Self-pay | Admitting: Cardiology

## 2011-04-10 VITALS — BP 140/72 | HR 56 | Ht 73.0 in | Wt 180.0 lb

## 2011-04-10 DIAGNOSIS — I5032 Chronic diastolic (congestive) heart failure: Secondary | ICD-10-CM

## 2011-04-10 DIAGNOSIS — I509 Heart failure, unspecified: Secondary | ICD-10-CM

## 2011-04-10 DIAGNOSIS — I10 Essential (primary) hypertension: Secondary | ICD-10-CM

## 2011-04-10 DIAGNOSIS — E7800 Pure hypercholesterolemia, unspecified: Secondary | ICD-10-CM

## 2011-04-10 DIAGNOSIS — I4891 Unspecified atrial fibrillation: Secondary | ICD-10-CM

## 2011-04-10 DIAGNOSIS — I2510 Atherosclerotic heart disease of native coronary artery without angina pectoris: Secondary | ICD-10-CM

## 2011-04-10 LAB — BASIC METABOLIC PANEL
Chloride: 99 mEq/L (ref 96–112)
Potassium: 3.7 mEq/L (ref 3.5–5.1)
Sodium: 136 mEq/L (ref 135–145)

## 2011-04-10 NOTE — Progress Notes (Signed)
HPI: Mr. Isakson is a pleasant gentleman with history of coronary artery disease, status post coronary artery bypassing graft, small abdominal aortic aneurysm, and atrial fibrillation. His most recent catheterization was in March of 2002. This followed an abnormal Myoview which showed an ejection fraction of 53% and moderate-to-severe ischemia in the inferior and lateral walls from the apex to the base. His catheterization at that time showed total occlusion of his native vessels. The left internal mammary artery to the left anterior descending was patent, and the distal left anterior descending filled well. The distal left anterior descending also filled a marginal branch and a posterior descending artery. His ejection fraction was 50%. Note, the saphenous vein graft to the posterior descending artery and posterolateral was completely occluded, and the saphenous vein graft to the circumflex was completely occluded. He has been treated medically. He also has previous Wenckebach on previous electrocardiogram. He has also had problems with volume excess in the past treated with Lasix. Abdominal ultrasound in October of 2012 revealed a 3.3 x 3.3 cm aneurysm and followup was recommended in one year. Echocardiogram In April of 2012 showed normal LV function, moderate biatrial enlargement, mild right ventricular enlargement, mild mitral regurgitation. Holter monitor showed atrial fibrillation with bradycardia. However the episodes of bradycardia typically occurred in the early morning hours. Since I last saw him in Dec 2012, his weight increased to 185 pounds and he had increased edema. His primary care physician increased his Lasix to 40 mg twice daily and he is now down to 175. He denies dyspnea, chest pain, palpitations or syncope. He fell again recently tripping over a cord.   Current Outpatient Prescriptions  Medication Sig Dispense Refill  . Acetaminophen (TYLENOL ARTHRITIS EXT RELIEF PO) Take by mouth as  needed.        Marland Kitchen aspirin 81 MG tablet 2 tabs po qd      . azelastine (ASTELIN) 137 MCG/SPRAY nasal spray Place 1 spray into the nose 2 (two) times daily. Use in each nostril as directed       . Calcium Carbonate-Vitamin D (CALCIUM + D) 600-200 MG-UNIT TABS Take 1 tablet by mouth.        . cetirizine (ZYRTEC) 10 MG tablet Take 10 mg by mouth daily.        . citalopram (CELEXA) 40 MG tablet Take 40 mg by mouth daily.        . clopidogrel (PLAVIX) 75 MG tablet Take 1 tablet (75 mg total) by mouth daily.  30 tablet  10  . Ferrous Sulfate (IRON) 325 (65 FE) MG TABS Take by mouth 2 (two) times daily.        . furosemide (LASIX) 40 MG tablet Take 1 tablet (40 mg total) by mouth 2 (two) times daily.  60 tablet  11  . Multiple Vitamins-Minerals (MULTI COMPLETE PO) Take by mouth daily.        . polyvinyl alcohol (LIQUIFILM TEARS) 1.4 % ophthalmic solution As directed       . simvastatin (ZOCOR) 80 MG tablet Take 40 mg by mouth at bedtime.       . vitamin B-12 (CYANOCOBALAMIN) 500 MCG tablet Take 500 mcg by mouth daily.        Marland Kitchen ZETIA 10 MG tablet TAKE ONE (1) TABLET EACH DAY  30 tablet  12     Past Medical History  Diagnosis Date  . Hypertension   . Coronary artery disease     a. s/p redo CABG 1988; cath  3/02: LAD, CFX and RCA occluded; S-PDA + PLBr of RCA + dCFX occluded, S-CFX occluded, L-LAD ok - treated medically  . Hyperlipidemia   . Abdominal aortic aneurysm     u/s 10/11: 3.5 x 3.5 cm  . TIA (transient ischemic attack)     hx of  . Atrial fibrillation     no coumadin 2/2 falls  . Mobitz type 1 second degree AV block   . Diastolic congestive heart failure     echo 2/10: Ef 50-55%, in [place], sept HK, mild Asc aorta dilataion, mild-mod BAE, mild LVH;   b. echo 4/12:  EF 60-65%, mild LVH, mod BAE, mild MR, PASP 45    Past Surgical History  Procedure Date  . Coronary artery bypass graft 1977    with redo 1988  . Prostatectomy     History   Social History  . Marital Status: Married     Spouse Name: N/A    Number of Children: N/A  . Years of Education: N/A   Occupational History  . Not on file.   Social History Main Topics  . Smoking status: Never Smoker   . Smokeless tobacco: Current User    Types: Chew  . Alcohol Use: 0.6 oz/week    1 Glasses of wine per week  . Drug Use: No  . Sexually Active: Not on file   Other Topics Concern  . Not on file   Social History Narrative  . No narrative on file    ROS: no fevers or chills, productive cough, hemoptysis, dysphasia, odynophagia, melena, hematochezia, dysuria, hematuria, rash, seizure activity, orthopnea, PND, pedal edema, claudication. Remaining systems are negative.  Physical Exam: Well-developed well-nourished in no acute distress.  Skin is warm and dry.  HEENT is normal.  Neck is supple. No thyromegaly.  Chest is clear to auscultation with normal expansion.  Cardiovascular exam is irregular Abdominal exam nontender or distended. No masses palpated. Extremities show no edema. neuro grossly intact  ECG atrial fibrillation at a rate of 55. Axis normal. Nonspecific ST changes.

## 2011-04-10 NOTE — Assessment & Plan Note (Signed)
Continue statin. 

## 2011-04-10 NOTE — Assessment & Plan Note (Signed)
Followup abdominal ultrasound October 2013.

## 2011-04-10 NOTE — Assessment & Plan Note (Signed)
Continue aspirin and statin. 

## 2011-04-10 NOTE — Assessment & Plan Note (Signed)
Rate controlled on no medications. Continue aspirin and Plavix. Not a Coumadin candidate given recurrent falls.

## 2011-04-10 NOTE — Assessment & Plan Note (Signed)
Patient's blood pressure is trending up at home. I have asked them to follow this and we will add Norvasc if needed.

## 2011-04-10 NOTE — Patient Instructions (Signed)
Your physician recommends that you schedule a follow-up appointment in: 3 MONTHS  DECREASE FUROSEMIDE TO 40 MG ONCE DAILY= TAKE AN EXTRA 40 MG AS NEEDED FOR SWELLING OR WEIGHT GAIN

## 2011-04-10 NOTE — Assessment & Plan Note (Signed)
Patient recently had his Lasix increased and his weight is now down 10 pounds. He had problems with dehydration with higher doses of Lasix in the past. I will decrease his Lasix to 40 mg daily. Once his weight which is 182 he will take an additional 40 mg daily when necessary increased weight gain of 2-3 pounds or increased edema. Check potassium and renal function.

## 2011-07-20 ENCOUNTER — Encounter: Payer: Self-pay | Admitting: Cardiology

## 2011-07-20 ENCOUNTER — Ambulatory Visit (INDEPENDENT_AMBULATORY_CARE_PROVIDER_SITE_OTHER): Payer: Medicare Other | Admitting: Cardiology

## 2011-07-20 VITALS — BP 132/60 | HR 49 | Wt 178.0 lb

## 2011-07-20 DIAGNOSIS — I2510 Atherosclerotic heart disease of native coronary artery without angina pectoris: Secondary | ICD-10-CM

## 2011-07-20 DIAGNOSIS — I10 Essential (primary) hypertension: Secondary | ICD-10-CM

## 2011-07-20 DIAGNOSIS — I4891 Unspecified atrial fibrillation: Secondary | ICD-10-CM

## 2011-07-20 DIAGNOSIS — E7800 Pure hypercholesterolemia, unspecified: Secondary | ICD-10-CM

## 2011-07-20 DIAGNOSIS — I5032 Chronic diastolic (congestive) heart failure: Secondary | ICD-10-CM

## 2011-07-20 DIAGNOSIS — I714 Abdominal aortic aneurysm, without rupture: Secondary | ICD-10-CM

## 2011-07-20 NOTE — Assessment & Plan Note (Signed)
Blood pressure controlled. Continue present medications. 

## 2011-07-20 NOTE — Assessment & Plan Note (Signed)
Patient euvolemic on examination. Continue present dose of Lasix. Take an additional 40 mg daily as needed. Check potassium and renal function.

## 2011-07-20 NOTE — Patient Instructions (Addendum)
Your physician wants you to follow-up in: 6 MONTHS WITH DR Jens Som You will receive a reminder letter in the mail two months in advance. If you don't receive a letter, please call our office to schedule the follow-up appointment.   Your physician recommends that you HAVE BLOOD WORK TODAY

## 2011-07-20 NOTE — Assessment & Plan Note (Signed)
Rate is controlled with no medications. Continue aspirin and Plavix. Not a Coumadin candidate given history of recurrent falls.

## 2011-07-20 NOTE — Assessment & Plan Note (Signed)
followup abdominal ultrasound October 2013.

## 2011-07-20 NOTE — Assessment & Plan Note (Signed)
Continue aspirin and statin. 

## 2011-07-20 NOTE — Progress Notes (Signed)
HPI: Mr. Philip Flores is a pleasant gentleman with history of coronary artery disease, status post coronary artery bypassing graft, small abdominal aortic aneurysm, and atrial fibrillation. His most recent catheterization was in March of 2002. This followed an abnormal Myoview which showed an ejection fraction of 53% and moderate-to-severe ischemia in the inferior and lateral walls from the apex to the base. His catheterization at that time showed total occlusion of his native vessels. The left internal mammary artery to the left anterior descending was patent, and the distal left anterior descending filled well. The distal left anterior descending also filled a marginal branch and a posterior descending artery. His ejection fraction was 50%. Note, the saphenous vein graft to the posterior descending artery and posterolateral was completely occluded, and the saphenous vein graft to the circumflex was completely occluded. He has been treated medically. He has also had problems with volume excess in the past treated with Lasix. Abdominal ultrasound in October of 2012 revealed a 3.3 x 3.3 cm aneurysm and followup was recommended in one year. Echocardiogram In April of 2012 showed normal LV function, moderate biatrial enlargement, mild right ventricular enlargement, mild mitral regurgitation. Holter monitor showed atrial fibrillation with bradycardia. However the episodes of bradycardia typically occurred in the early morning hours. Since I last saw him in March 2012, he denies dyspnea, chest pain, palpitations or syncope. His pedal edema is well controlled. He fell approximately 3 weeks ago.   Current Outpatient Prescriptions  Medication Sig Dispense Refill  . Acetaminophen (TYLENOL ARTHRITIS EXT RELIEF PO) Take by mouth as needed.        Marland Kitchen aspirin 81 MG tablet 2 tabs po qd      . azelastine (ASTELIN) 137 MCG/SPRAY nasal spray Place 1 spray into the nose 2 (two) times daily. Use in each nostril as directed       .  Calcium Carbonate-Vitamin D (CALCIUM + D) 600-200 MG-UNIT TABS Take 1 tablet by mouth.        . cetirizine (ZYRTEC) 10 MG tablet Take 10 mg by mouth daily.        . citalopram (CELEXA) 40 MG tablet Take 40 mg by mouth daily.        . clopidogrel (PLAVIX) 75 MG tablet Take 1 tablet (75 mg total) by mouth daily.  30 tablet  10  . Ferrous Sulfate (IRON) 325 (65 FE) MG TABS Take by mouth 2 (two) times daily.        . furosemide (LASIX) 40 MG tablet Take 1 tablet (40 mg total) by mouth daily.  60 tablet  11  . lisinopril-hydrochlorothiazide (PRINZIDE,ZESTORETIC) 20-25 MG per tablet Take 1 tablet by mouth daily.      . Multiple Vitamins-Minerals (MULTI COMPLETE PO) Take by mouth daily.        . polyvinyl alcohol (LIQUIFILM TEARS) 1.4 % ophthalmic solution As directed       . simvastatin (ZOCOR) 80 MG tablet Take 40 mg by mouth at bedtime.       . vitamin B-12 (CYANOCOBALAMIN) 500 MCG tablet Take 500 mcg by mouth daily.        Marland Kitchen ZETIA 10 MG tablet TAKE ONE (1) TABLET EACH DAY  30 tablet  12     Past Medical History  Diagnosis Date  . Hypertension   . Coronary artery disease     a. s/p redo CABG 1988; cath 3/02: LAD, CFX and RCA occluded; S-PDA + PLBr of RCA + dCFX occluded, S-CFX occluded, L-LAD ok -  treated medically  . Hyperlipidemia   . Abdominal aortic aneurysm     u/s 10/11: 3.5 x 3.5 cm  . TIA (transient ischemic attack)     hx of  . Atrial fibrillation     no coumadin 2/2 falls  . Mobitz type 1 second degree AV block   . Diastolic congestive heart failure     echo 2/10: Ef 50-55%, in [place], sept HK, mild Asc aorta dilataion, mild-mod BAE, mild LVH;   b. echo 4/12:  EF 60-65%, mild LVH, mod BAE, mild MR, PASP 45    Past Surgical History  Procedure Date  . Coronary artery bypass graft 1977    with redo 1988  . Prostatectomy     History   Social History  . Marital Status: Married    Spouse Name: N/A    Number of Children: N/A  . Years of Education: N/A   Occupational History    . Not on file.   Social History Main Topics  . Smoking status: Never Smoker   . Smokeless tobacco: Current User    Types: Chew  . Alcohol Use: 0.6 oz/week    1 Glasses of wine per week  . Drug Use: No  . Sexually Active: Not on file   Other Topics Concern  . Not on file   Social History Narrative  . No narrative on file    ROS: no fevers or chills, productive cough, hemoptysis, dysphasia, odynophagia, melena, hematochezia, dysuria, hematuria, rash, seizure activity, orthopnea, PND, pedal edema, claudication. Remaining systems are negative.  Physical Exam: Well-developed frail in no acute distress.  Skin is warm and dry. Diffuse ecchymosis HEENT is normal.  Neck is supple.  Chest is clear to auscultation with normal expansion.  Cardiovascular exam is irregular and bradycardic Abdominal exam nontender or distended. No masses palpated. Extremities show no edema. neuro grossly intact  ECG atrial fibrillation with a slow ventricular response of 49. Prior inferior infarct. Nonspecific ST changes.

## 2011-07-20 NOTE — Assessment & Plan Note (Signed)
Continue statin. Lipids and liver monitored by primary care. 

## 2011-07-21 LAB — BASIC METABOLIC PANEL
CO2: 28 mEq/L (ref 19–32)
Glucose, Bld: 106 mg/dL — ABNORMAL HIGH (ref 70–99)
Potassium: 4.1 mEq/L (ref 3.5–5.1)
Sodium: 138 mEq/L (ref 135–145)

## 2011-08-05 ENCOUNTER — Other Ambulatory Visit: Payer: Self-pay | Admitting: Cardiology

## 2011-09-11 ENCOUNTER — Other Ambulatory Visit: Payer: Self-pay | Admitting: *Deleted

## 2011-09-11 MED ORDER — EZETIMIBE 10 MG PO TABS: 10.0000 mg | ORAL_TABLET | Freq: Every day | ORAL | Status: DC

## 2012-02-02 ENCOUNTER — Ambulatory Visit (INDEPENDENT_AMBULATORY_CARE_PROVIDER_SITE_OTHER): Payer: Medicare Other | Admitting: Cardiology

## 2012-02-02 ENCOUNTER — Encounter: Payer: Self-pay | Admitting: Cardiology

## 2012-02-02 VITALS — BP 131/70 | HR 56 | Wt 178.0 lb

## 2012-02-02 DIAGNOSIS — I714 Abdominal aortic aneurysm, without rupture: Secondary | ICD-10-CM

## 2012-02-02 DIAGNOSIS — I4891 Unspecified atrial fibrillation: Secondary | ICD-10-CM

## 2012-02-02 NOTE — Patient Instructions (Addendum)
Your physician wants you to follow-up in: 6 MONTHS WITH DR CRENSHAW You will receive a reminder letter in the mail two months in advance. If you don't receive a letter, please call our office to schedule the follow-up appointment.   Your physician has requested that you have an abdominal aorta duplex. During this test, an ultrasound is used to evaluate the aorta. Allow 30 minutes for this exam. Do not eat after midnight the day before and avoid carbonated beverages  

## 2012-02-02 NOTE — Assessment & Plan Note (Signed)
Continue statin. Lipids and liver monitored by primary care. 

## 2012-02-02 NOTE — Assessment & Plan Note (Signed)
euvolemic on examination. Continue present dose of diuretic. Potassium and renal function has been checked recently by primary care per patient.

## 2012-02-02 NOTE — Assessment & Plan Note (Signed)
Continue aspirin and statin. 

## 2012-02-02 NOTE — Assessment & Plan Note (Signed)
Continue present blood pressure medications. 

## 2012-02-02 NOTE — Assessment & Plan Note (Signed)
Schedule follow-up ultrasound. 

## 2012-02-02 NOTE — Progress Notes (Signed)
HPI: Philip Flores is a pleasant gentleman with history of coronary artery disease, status post coronary artery bypassing graft, small abdominal aortic aneurysm, and atrial fibrillation. His most recent catheterization was in March of 2002. This followed an abnormal Myoview which showed an ejection fraction of 53% and moderate-to-severe ischemia in the inferior and lateral walls from the apex to the base. His catheterization at that time showed total occlusion of his native vessels. The left internal mammary artery to the left anterior descending was patent, and the distal left anterior descending filled well. The distal left anterior descending also filled a marginal branch and a posterior descending artery. His ejection fraction was 50%. Note, the saphenous vein graft to the posterior descending artery and posterolateral was completely occluded, and the saphenous vein graft to the circumflex was completely occluded. He has been treated medically. He has also had problems with volume excess in the past treated with Lasix. Abdominal ultrasound in October of 2012 revealed a 3.3 x 3.3 cm aneurysm and followup was recommended in one year. Echocardiogram In April of 2012 showed normal LV function, moderate biatrial enlargement, mild right ventricular enlargement, mild mitral regurgitation. Holter monitor showed atrial fibrillation with bradycardia. However the episodes of bradycardia typically occurred in the early morning hours. Since I last saw him in June of 2013, he denies dyspnea, chest pain, palpitations, syncope or pedal edema.   Current Outpatient Prescriptions  Medication Sig Dispense Refill  . Acetaminophen (TYLENOL ARTHRITIS EXT RELIEF PO) Take by mouth as needed.        Marland Kitchen aspirin 81 MG tablet 2 tabs po qd      . azelastine (ASTELIN) 137 MCG/SPRAY nasal spray Place 1 spray into the nose 2 (two) times daily. Use in each nostril as directed       . Calcium Carbonate-Vitamin D (CALCIUM + D) 600-200  MG-UNIT TABS Take 1 tablet by mouth.        . cetirizine (ZYRTEC) 10 MG tablet Take 10 mg by mouth daily.        . citalopram (CELEXA) 40 MG tablet Take 40 mg by mouth daily.        . clopidogrel (PLAVIX) 75 MG tablet TAKE ONE (1) TABLET EACH DAY  30 tablet  12  . ezetimibe (ZETIA) 10 MG tablet Take 1 tablet (10 mg total) by mouth daily.  30 tablet  12  . Ferrous Sulfate (IRON) 325 (65 FE) MG TABS Take by mouth 2 (two) times daily.        . furosemide (LASIX) 40 MG tablet Take 1 tablet (40 mg total) by mouth daily.  60 tablet  11  . lisinopril-hydrochlorothiazide (PRINZIDE,ZESTORETIC) 20-25 MG per tablet Take 1 tablet by mouth daily.      . Multiple Vitamins-Minerals (MULTI COMPLETE PO) Take by mouth daily.        . simvastatin (ZOCOR) 80 MG tablet Take 40 mg by mouth at bedtime.       . vitamin B-12 (CYANOCOBALAMIN) 500 MCG tablet Take 500 mcg by mouth daily.           Past Medical History  Diagnosis Date  . Hypertension   . Coronary artery disease     a. s/p redo CABG 1988; cath 3/02: LAD, CFX and RCA occluded; S-PDA + PLBr of RCA + dCFX occluded, S-CFX occluded, L-LAD ok - treated medically  . Hyperlipidemia   . Abdominal aortic aneurysm     u/s 10/11: 3.5 x 3.5 cm  . TIA (transient  ischemic attack)     hx of  . Atrial fibrillation     no coumadin 2/2 falls  . Mobitz type 1 second degree AV block   . Diastolic congestive heart failure     echo 2/10: Ef 50-55%, in [place], sept HK, mild Asc aorta dilataion, mild-mod BAE, mild LVH;   b. echo 4/12:  EF 60-65%, mild LVH, mod BAE, mild MR, PASP 45    Past Surgical History  Procedure Date  . Coronary artery bypass graft 1977    with redo 1988  . Prostatectomy     History   Social History  . Marital Status: Married    Spouse Name: N/A    Number of Children: N/A  . Years of Education: N/A   Occupational History  . Not on file.   Social History Main Topics  . Smoking status: Never Smoker   . Smokeless tobacco: Current User     Types: Chew  . Alcohol Use: 0.6 oz/week    1 Glasses of wine per week  . Drug Use: No  . Sexually Active: Not on file   Other Topics Concern  . Not on file   Social History Narrative  . No narrative on file    ROS: no fevers or chills, productive cough, hemoptysis, dysphasia, odynophagia, melena, hematochezia, dysuria, hematuria, rash, seizure activity, orthopnea, PND, pedal edema, claudication. Remaining systems are negative.  Physical Exam: Well-developed well-nourished in no acute distress.  Skin is warm and dry.  HEENT is normal.  Neck is supple.  Chest is clear to auscultation with normal expansion.  Cardiovascular exam is irregular, 2/6 systolic murmur left sternal border Abdominal exam nontender or distended. No masses palpated. Extremities show no edema. neuro grossly intact  ECG atrial fibrillation at a rate of 56. Nonspecific ST changes.

## 2012-02-02 NOTE — Assessment & Plan Note (Signed)
Patient's rate is controlled on no medications. Continue aspirin. Not a Coumadin candidate given history of recurrent falls.

## 2012-02-23 ENCOUNTER — Encounter (INDEPENDENT_AMBULATORY_CARE_PROVIDER_SITE_OTHER): Payer: Medicare Other

## 2012-02-23 DIAGNOSIS — I714 Abdominal aortic aneurysm, without rupture, unspecified: Secondary | ICD-10-CM

## 2012-08-13 ENCOUNTER — Other Ambulatory Visit: Payer: Self-pay | Admitting: *Deleted

## 2012-08-13 MED ORDER — CLOPIDOGREL BISULFATE 75 MG PO TABS: ORAL_TABLET | ORAL | Status: DC

## 2012-09-18 ENCOUNTER — Encounter: Payer: Self-pay | Admitting: Physician Assistant

## 2012-09-26 ENCOUNTER — Ambulatory Visit (INDEPENDENT_AMBULATORY_CARE_PROVIDER_SITE_OTHER): Payer: Medicare Other | Admitting: Physician Assistant

## 2012-09-26 ENCOUNTER — Encounter: Payer: Self-pay | Admitting: Physician Assistant

## 2012-09-26 VITALS — BP 146/73 | HR 53 | Ht 72.0 in | Wt 182.0 lb

## 2012-09-26 DIAGNOSIS — I2510 Atherosclerotic heart disease of native coronary artery without angina pectoris: Secondary | ICD-10-CM

## 2012-09-26 DIAGNOSIS — R5381 Other malaise: Secondary | ICD-10-CM

## 2012-09-26 DIAGNOSIS — I5032 Chronic diastolic (congestive) heart failure: Secondary | ICD-10-CM

## 2012-09-26 DIAGNOSIS — I10 Essential (primary) hypertension: Secondary | ICD-10-CM

## 2012-09-26 DIAGNOSIS — R5383 Other fatigue: Secondary | ICD-10-CM

## 2012-09-26 DIAGNOSIS — I714 Abdominal aortic aneurysm, without rupture, unspecified: Secondary | ICD-10-CM

## 2012-09-26 DIAGNOSIS — I4891 Unspecified atrial fibrillation: Secondary | ICD-10-CM

## 2012-09-26 NOTE — Progress Notes (Signed)
[address] [place], [place]  [address] Phone: [phone] Fax:  [phone]  Date:  09/26/2012   ID:  Philip Mariner Sr., DOB [birth year], MRN [id number]  PCP:  Philip Matar, MD  Cardiologist:  Dr. Olga Flores     History of Present Illness: Philip Egerton Sr. is a 79 y.o. male who returns for followup.  He has a hx of CAD, status post CABG, small AAA and atrial fibrillation, diastolic CHF. LHC 3/02 following abnormal Myoview (EF 53%, moderate to severe inferior and lateral ischemia): Total occlusion of native vessels, LIMA-LAD patent, distal LAD filled well, distal LAD filled marginal branch and PDA, SVG-PDA and PL occluded, SVG-circumflex occluded, EF 50%. Medical therapy has been recommended.  Echo 05/2010: Normal LV function, moderate BAE, mild RVE, mild MR.  Holter monitor 05/2010: Atrial fibrillation with episodes of bradycardia in the early morning hours. 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  Last seen by Dr. Jens Flores 01/2012 with plans to follow up in 6 months.  Over the last several weeks, he notes significant exhaustion. He has been diagnosed with depression by his PCP and placed on citalopram. Started one week ago. He does admit to anhedonia. He overall feels weak. His PCP has done an extensive evaluation recently. He denies chest pain, shortness of breath, orthopnea, PND or edema. He denies syncope, dizziness or near-syncope. Of note, his previous anginal symptom was chest pain.  Labs (6/13):  K 4.1, Cr 0.9  Wt Readings from Last 3 Encounters:  09/26/12 182 lb (82.555 kg)  02/02/12 178 lb (80.74 kg)  07/20/11 178 lb (80.74 kg)     Past Medical History  Diagnosis Date  . Hypertension   . Coronary artery disease     a. s/p redo CABG 1988; cath 3/02: LAD, CFX and RCA occluded; S-PDA + PLBr of RCA + dCFX occluded, S-CFX occluded, L-LAD ok - treated medically  . Hyperlipidemia   . TIA (transient ischemic  attack)     hx of  . Atrial fibrillation     no coumadin 2/2 falls  . Mobitz type 1 second degree AV block   . Diastolic congestive heart failure     echo 2/10: Ef 50-55%, in AK, sept HK, mild Asc aorta dilataion, mild-mod BAE, mild LVH;   b. echo 4/12:  EF 60-65%, mild LVH, mod BAE, mild MR, PASP 45  . 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 Acetaminophen (TYLENOL ARTHRITIS EXT RELIEF PO) Take by mouth as needed.        Marland Kitchen aspirin 81 MG tablet 2 tabs po qd      . azelastine (ASTELIN) 137 MCG/SPRAY nasal spray Place 1 spray into the nose 2 (two) times daily. Use in each nostril as directed       . Calcium Carbonate-Vitamin D (CALCIUM + D) 600-200 MG-UNIT TABS Take 1 tablet by mouth.        . cetirizine (ZYRTEC) 10 MG tablet Take 10 mg by mouth daily.        . citalopram (CELEXA) 40 MG tablet Take 40 mg by mouth daily.        . clopidogrel (PLAVIX) 75 MG tablet TAKE ONE (1) TABLET EACH DAY  30 tablet  12  .  ezetimibe (ZETIA) 10 MG tablet Take 1 tablet (10 mg total) by mouth daily.  30 tablet  12  . Ferrous Sulfate (IRON) 325 (65 FE) MG TABS Take by mouth 2 (two) times daily.        Marland Kitchen FLUoxetine (PROZAC) 20 MG tablet Take 20 mg by mouth daily.      . furosemide (LASIX) 40 MG tablet Take 1 tablet (40 mg total) by mouth daily.  60 tablet  11  . lisinopril-hydrochlorothiazide (PRINZIDE,ZESTORETIC) 20-25 MG per tablet Take 1 tablet by mouth daily.      . Multiple Vitamins-Minerals (MULTI COMPLETE PO) Take by mouth daily.        . simvastatin (ZOCOR) 80 MG tablet Take 40 mg by mouth at bedtime.       . vitamin B-12 (CYANOCOBALAMIN) 500 MCG tablet Take 500 mcg by mouth daily.         No current facility-administered medications for this visit.    Allergies:   No Known Allergies  Social History:  The patient  reports that  he has never smoked. His smokeless tobacco use includes Chew. He reports that he drinks about 0.6 ounces of alcohol per week. He reports that he does not use illicit drugs.   ROS:  Please see the history of present illness.   He had noted black stools recently and has seen GI. No evidence of bleeding has been found per his report.   All other systems reviewed and negative.   PHYSICAL EXAM: VS:  BP 146/73  Pulse 53  Ht 6' (1.829 m)  Wt 182 lb (82.555 kg)  BMI 24.68 kg/m2 Well nourished, well developed, in no acute distress HEENT: normal Neck: no JVD Cardiac:  normal S1, S2; irregularly irregular rhythm; no murmur Lungs:  clear to auscultation bilaterally, no wheezing, rhonchi or rales Abd: soft, nontender, no hepatomegaly Ext: no edema Skin: warm and dry Neuro:  CNs 2-12 intact, no focal abnormalities noted  EKG:  Atrial fibrillation, HR 53, normal axis, nonspecific ST-T wave changes     ASSESSMENT AND PLAN:  1. Fatigue: As noted, he has had an extensive evaluation by his PCP. He has been diagnosed with depression and started on SSRI therapy. His symptom of angina in the past has never been that of exhaustion. He does have bradycardia but his heart rate is unchanged since last visit. His ECG is also unchanged. We discussed possibly proceeding with an echocardiogram to assess for worsening LV function. However, I have asked him to proceed with treatment of his depression and followup in 6-8 weeks. If his symptoms are not improving, we could consider further cardiac workup at that time. He knows to return sooner if his symptoms should worsen. I will request a copy of his labs from his PCP for his chart. 2. CAD: No further cardiac workup at this time. He denies chest pain. Continue aspirin, Plavix and statin. 3. AAA: Stable by recent ultrasound.  4. Chronic Diastolic CHF:  Volume stable. Continue current dose of Lasix. 5. Atrial Fibrillation: He is not on Coumadin due to prior history of  falling. He continues on dual antiplatelet therapy. Rate is controlled. 6. Hypertension: Controlled. 7. Hyperlipidemia: Continue statin. 8. Disposition: Follow up with Dr. Jens Flores in 6-8 weeks. Of note, I did tell the patient to reschedule for 6 months should his symptoms of exhaustion resolve with treatment of his depression.  Signed, Tereso Newcomer, PA-C  09/26/2012 1:48 PM

## 2012-09-26 NOTE — Patient Instructions (Addendum)
Your physician recommends that you continue on your current medications as directed. Please refer to the Current Medication list given to you today.  Your physician recommends that you schedule a follow-up appointment in: 6-8 weeks with Dr.Crenshaw

## 2012-10-03 ENCOUNTER — Other Ambulatory Visit: Payer: Self-pay

## 2012-10-03 MED ORDER — EZETIMIBE 10 MG PO TABS: 10.0000 mg | ORAL_TABLET | Freq: Every day | ORAL | Status: DC

## 2012-11-07 ENCOUNTER — Ambulatory Visit (INDEPENDENT_AMBULATORY_CARE_PROVIDER_SITE_OTHER): Payer: Medicare Other | Admitting: Cardiology

## 2012-11-07 ENCOUNTER — Encounter: Payer: Self-pay | Admitting: Cardiology

## 2012-11-07 ENCOUNTER — Encounter (INDEPENDENT_AMBULATORY_CARE_PROVIDER_SITE_OTHER): Payer: Self-pay

## 2012-11-07 VITALS — BP 116/56 | HR 60 | Ht 72.0 in | Wt 179.0 lb

## 2012-11-07 DIAGNOSIS — I714 Abdominal aortic aneurysm, without rupture: Secondary | ICD-10-CM

## 2012-11-07 DIAGNOSIS — I10 Essential (primary) hypertension: Secondary | ICD-10-CM

## 2012-11-07 DIAGNOSIS — I5032 Chronic diastolic (congestive) heart failure: Secondary | ICD-10-CM

## 2012-11-07 DIAGNOSIS — I2510 Atherosclerotic heart disease of native coronary artery without angina pectoris: Secondary | ICD-10-CM

## 2012-11-07 DIAGNOSIS — I4891 Unspecified atrial fibrillation: Secondary | ICD-10-CM

## 2012-11-07 DIAGNOSIS — E7800 Pure hypercholesterolemia, unspecified: Secondary | ICD-10-CM

## 2012-11-07 NOTE — Patient Instructions (Signed)
Your physician wants you to follow-up in: 6 MONTHS WITH DR CRENSHAW You will receive a reminder letter in the mail two months in advance. If you don't receive a letter, please call our office to schedule the follow-up appointment.  

## 2012-11-07 NOTE — Assessment & Plan Note (Signed)
Patient remains in atrial fibrillation on examination.his rate is controlled on the medications. Continue aspirin. Not a Coumadin candidate given history of recurrent falls.

## 2012-11-07 NOTE — Assessment & Plan Note (Signed)
Continue present blood pressure medications. 

## 2012-11-07 NOTE — Progress Notes (Signed)
HPI: Mr. Heffner is a pleasant gentleman with history of coronary artery disease, status post coronary artery bypassing graft, small abdominal aortic aneurysm, and atrial fibrillation. His most recent catheterization was in March of 2002. This followed an abnormal Myoview which showed an ejection fraction of 53% and moderate-to-severe ischemia in the inferior and lateral walls from the apex to the base. His catheterization at that time showed total occlusion of his native vessels. The left internal mammary artery to the left anterior descending was patent, and the distal left anterior descending filled well. The distal left anterior descending also filled a marginal branch and a posterior descending artery. His ejection fraction was 50%. Note, the saphenous vein graft to the posterior descending artery and posterolateral was completely occluded, and the saphenous vein graft to the circumflex was completely occluded. He has been treated medically. He has also had problems with volume excess in the past treated with Lasix. Abdominal ultrasound in October of 2014 revealed a 3.0 x 3.3 cm aneurysm and followup was recommended in one year. Echocardiogram In April of 2012 showed normal LV function, moderate biatrial enlargement, mild right ventricular enlargement, mild mitral regurgitation. Holter monitor showed atrial fibrillation with bradycardia. However the episodes of bradycardia typically occurred in the early morning hours. Since I last saw him in Jan of 2013, he denies dyspnea, chest pain, palpitations, syncope or pedal edema. He has had some weak spells in the morning that improves with eating.   Current Outpatient Prescriptions  Medication Sig Dispense Refill  . Acetaminophen (TYLENOL ARTHRITIS EXT RELIEF PO) Take by mouth as needed.        Marland Kitchen aspirin 81 MG tablet 2 tabs po qd      . azelastine (ASTELIN) 137 MCG/SPRAY nasal spray Place 1 spray into the nose 2 (two) times daily. Use in each nostril  as directed       . Calcium Carbonate-Vitamin D (CALCIUM + D) 600-200 MG-UNIT TABS Take 1 tablet by mouth.        . cetirizine (ZYRTEC) 10 MG tablet Take 10 mg by mouth daily.        . citalopram (CELEXA) 40 MG tablet Take 40 mg by mouth daily.        . clopidogrel (PLAVIX) 75 MG tablet TAKE ONE (1) TABLET EACH DAY  30 tablet  12  . ezetimibe (ZETIA) 10 MG tablet Take 1 tablet (10 mg total) by mouth daily.  30 tablet  6  . Ferrous Sulfate (IRON) 325 (65 FE) MG TABS Take by mouth 2 (two) times daily.        Marland Kitchen FLUoxetine (PROZAC) 20 MG tablet Take 20 mg by mouth daily.      . furosemide (LASIX) 40 MG tablet Take 1 tablet (40 mg total) by mouth daily.  60 tablet  11  . lisinopril-hydrochlorothiazide (PRINZIDE,ZESTORETIC) 20-25 MG per tablet Take 1 tablet by mouth daily.      . Multiple Vitamins-Minerals (MULTI COMPLETE PO) Take by mouth daily.        . simvastatin (ZOCOR) 80 MG tablet Take 40 mg by mouth at bedtime.       . vitamin B-12 (CYANOCOBALAMIN) 500 MCG tablet Take 500 mcg by mouth daily.         No current facility-administered medications for this visit.     Past Medical History  Diagnosis Date  . Hypertension   . Coronary artery disease     a. s/p redo CABG 1988; cath 3/02: LAD,  CFX and RCA occluded; S-PDA + PLBr of RCA + dCFX occluded, S-CFX occluded, L-LAD ok - treated medically  . Hyperlipidemia   . TIA (transient ischemic attack)     hx of  . Atrial fibrillation     no coumadin 2/2 falls  . Mobitz type 1 second degree AV block   . Diastolic congestive heart failure     echo 2/10: Ef 50-55%, in AK, sept HK, mild Asc aorta dilataion, mild-mod BAE, mild LVH;   b. echo 4/12:  EF 60-65%, mild LVH, mod BAE, mild MR, PASP 45  . Abdominal aortic aneurysm     u/s 10/11: 3.5 x 3.5 cm;  Abdominal US 01/2012: Stable dimensions of juxtarenal AAA 3 x 3.3 cm-follow up 1 year    Past Surgical History  Procedure Laterality Date  . Coronary artery bypass graft  1977    with redo 1988    . Prostatectomy      History   Social History  . Marital Status: Married    Spouse Name: N/A    Number of Children: N/A  . Years of Education: N/A   Occupational History  . Not on file.   Social History Main Topics  . Smoking status: Never Smoker   . Smokeless tobacco: Current User    Types: Chew  . Alcohol Use: 0.6 oz/week    1 Glasses of wine per week  . Drug Use: No  . Sexual Activity: Not on file   Other Topics Concern  . Not on file   Social History Narrative  . No narrative on file    ROS: no fevers or chills, productive cough, hemoptysis, dysphasia, odynophagia, melena, hematochezia, dysuria, hematuria, rash, seizure activity, orthopnea, PND, pedal edema, claudication. Remaining systems are negative.  Physical Exam: Well-developed well-nourished in no acute distress.  Skin is warm and dry.  HEENT is normal.  Neck is supple.  Chest is clear to auscultation with normal expansion.  Cardiovascular exam is irregular Abdominal exam nontender or distended. No masses palpated. Extremities show no edema. neuro grossly intact

## 2012-11-07 NOTE — Assessment & Plan Note (Signed)
Continue present dose of Lasix. Euvolemic on examination. 

## 2012-11-07 NOTE — Assessment & Plan Note (Signed)
Continue aspirin and statin. 

## 2012-11-07 NOTE — Assessment & Plan Note (Signed)
Continue statin. 

## 2012-11-07 NOTE — Assessment & Plan Note (Signed)
Abdominal ultrasound to be repeated in October 2015.

## 2012-12-19 ENCOUNTER — Ambulatory Visit (INDEPENDENT_AMBULATORY_CARE_PROVIDER_SITE_OTHER): Payer: Medicare Other

## 2012-12-19 ENCOUNTER — Encounter (INDEPENDENT_AMBULATORY_CARE_PROVIDER_SITE_OTHER): Payer: Self-pay

## 2012-12-19 VITALS — BP 108/56 | HR 62 | Resp 18

## 2012-12-19 DIAGNOSIS — B351 Tinea unguium: Secondary | ICD-10-CM

## 2012-12-19 DIAGNOSIS — M7751 Other enthesopathy of right foot: Secondary | ICD-10-CM

## 2012-12-19 DIAGNOSIS — M79609 Pain in unspecified limb: Secondary | ICD-10-CM

## 2012-12-19 DIAGNOSIS — M7750 Other enthesopathy of unspecified foot: Secondary | ICD-10-CM

## 2012-12-19 DIAGNOSIS — M722 Plantar fascial fibromatosis: Secondary | ICD-10-CM

## 2012-12-19 NOTE — Progress Notes (Signed)
  Subjective:    Patient ID: Philip Mariner Sr., male    DOB: Jun 12, 1923, 79 y.o.   MRN: [id number]  HPI patient presents this time for followup mycotic nail care debridement. Continues to have painful discolored brittle discolored thickened nails. Patient also is a new complaint inferior right heel painful with walking activities the plantar central heel area as painful and tender with and without shoes is been bothering him for couple of weeks now. No history of injury or trauma noted    Review of Systems  Constitutional: Negative.   HENT: Positive for congestion and hearing loss.   Eyes: Negative.   Respiratory: Negative.   Cardiovascular: Negative.   Gastrointestinal: Negative.   Endocrine: Negative.   Genitourinary: Negative.   Musculoskeletal: Positive for neck pain.  Skin: Negative.   Allergic/Immunologic: Negative.   Neurological: Negative.   Hematological: Bruises/bleeds easily.  Psychiatric/Behavioral: Negative.        Objective:   Physical Exam Neurovascular status is intact with pedal pulses palpable follows DP +2/4 PT plus one over 4 bilateral. Capillary fill time 3-4 seconds all digits skin temperature warm turgor normal no edema rubor pallor or varicosities noted. Neurologically epicritic and proprioceptive sensations intact and symmetric bilateral. There is normal plantar response and DTRs noted. Dermatologically skin color pigment normal hair growth absent nails thick brittle yellow brown discolored and incurvated tender on palpation and attempted debridement at this time. No open wounds or ulcerations are noted. Patient in general has dry scaling skin. Orthopedic exam there is pain on palpation inferior calcaneal tubercle area right heel. On palpation there is a nodularity or movable bursal sac which is painful tender and symptomatic. No x-rays taken at this time there is no pain on lateral compression or posterior heel area strictly on the inferior calcaneal tubercle site.  It is otherwise rectus muscle strengths unremarkable gait is unremarkable.       Assessment & Plan:  Assessments at this time #1 onychomycosis painful mycotic nails 1 through 5 bilateral incurvated and brittle and crumbly debrided and the presence of pain symptomatology continue followup every 3 months for continued palliative care.  Assessment number2. patient does have suspect plantar fasciitis or calcaneal bursitis inferior aspect right heel. Is not a candidate for NSAID therapy due to his cardiovascular history will maintain Tylenol as needed for pain recommend warm compress ice pack be applied to the heel area also suggested thick athletic or walking shoe or Oxford shoe at all times avoid going barefoot indications. Discuss possibility of a steroid injection if no improvement we'll contact us if this fails to improve with the next several weeks. Otherwise followup for his three-month palliative care visit. Next  Alvan Dame DPM

## 2012-12-19 NOTE — Patient Instructions (Signed)
Plantar Fasciitis Plantar fasciitis is a common condition that causes foot pain. It is soreness (inflammation) of the band of tough fibrous tissue on the bottom of the foot that runs from the heel bone (calcaneus) to the ball of the foot. The cause of this soreness may be from excessive standing, poor fitting shoes, running on hard surfaces, being overweight, having an abnormal walk, or overuse (this is common in runners) of the painful foot or feet. It is also common in aerobic exercise dancers and ballet dancers. SYMPTOMS  Most people with plantar fasciitis complain of:  Severe pain in the morning on the bottom of their foot especially when taking the first steps out of bed. This pain recedes after a few minutes of walking.  Severe pain is experienced also during walking following a long period of inactivity.  Pain is worse when walking barefoot or up stairs DIAGNOSIS   Your caregiver will diagnose this condition by examining and feeling your foot.  Special tests such as X-rays of your foot, are usually not needed. PREVENTION   Consult a sports medicine professional before beginning a new exercise program.  Walking programs offer a good workout. With walking there is a lower chance of overuse injuries common to runners. There is less impact and less jarring of the joints.  Begin all new exercise programs slowly. If problems or pain develop, decrease the amount of time or distance until you are at a comfortable level.  Wear good shoes and replace them regularly.  Stretch your foot and the heel cords at the back of the ankle (Achilles tendon) both before and after exercise.  Run or exercise on even surfaces that are not hard. For example, asphalt is better than pavement.  Do not run barefoot on hard surfaces.  If using a treadmill, vary the incline.  Do not continue to workout if you have foot or joint problems. Seek professional help if they do not improve. HOME CARE INSTRUCTIONS     Avoid activities that cause you pain until you recover.  Use ice or cold packs on the problem or painful areas after working out.  Only take over-the-counter or prescription medicines for pain, discomfort, or fever as directed by your caregiver.  Soft shoe inserts or athletic shoes with air or gel sole cushions may be helpful.  If problems continue or become more severe, consult a sports medicine caregiver or your own health care provider. Cortisone is a potent anti-inflammatory medication that may be injected into the painful area. You can discuss this treatment with your caregiver. MAKE SURE YOU:   Understand these instructions.  Will watch your condition.  Will get help right away if you are not doing well or get worse. Document Released: 10/11/2000 Document Revised: 04/10/2011 Document Reviewed: 12/11/2007 [facility] Patient Information 2014 [place], [place]. Ringworm, Nail A fungal infection of the nail (tinea unguium/onychomycosis) is common. It is common as the visible part of the nail is composed of dead cells which have no blood supply to help prevent infection. It occurs because fungi are everywhere and will pick any opportunity to grow on any dead material. Because nails are very slow growing they require up to 2 years of treatment with anti-fungal medications. The entire nail back to the base is infected. This includes approximately  of the nail which you cannot see. If your caregiver has prescribed a medication by mouth, take it every day and as directed. No progress will be seen for at least 6 to 9 months.  Do not be disappointed! Because fungi live on dead cells with little or no exposure to blood supply, medication delivery to the infection is slow; thus the cure is slow. It is also why you can observe no progress in the first 6 months. The nail becoming cured is the base of the nail, as it has the blood supply. Topical medication such as creams and ointments are usually not  effective. Important in successful treatment of nail fungus is closely following the medication regimen that your doctor prescribes. Sometimes you and your caregiver may elect to speed up this process by surgical removal of all the nails. Even this may still require 6 to 9 months of additional oral medications. See your caregiver as directed. Remember there will be no visible improvement for at least 6 months. See your caregiver sooner if other signs of infection (redness and swelling) develop. Document Released: 01/14/2000 Document Revised: 04/10/2011 Document Reviewed: 03/24/2008 [facility] Patient Information 2014 [place], [place].

## 2013-03-20 ENCOUNTER — Ambulatory Visit: Payer: Medicare Other

## 2013-03-25 ENCOUNTER — Ambulatory Visit: Payer: Medicare Other | Admitting: Cardiology

## 2013-04-24 ENCOUNTER — Ambulatory Visit: Payer: Medicare Other

## 2013-05-08 ENCOUNTER — Other Ambulatory Visit: Payer: Self-pay

## 2013-05-08 MED ORDER — FUROSEMIDE 40 MG PO TABS: 40.0000 mg | ORAL_TABLET | Freq: Every day | ORAL | Status: DC

## 2013-05-08 MED ORDER — EZETIMIBE 10 MG PO TABS: 10.0000 mg | ORAL_TABLET | Freq: Every day | ORAL | Status: DC

## 2013-05-15 ENCOUNTER — Ambulatory Visit: Payer: Medicare Other | Admitting: Cardiology

## 2013-05-21 ENCOUNTER — Ambulatory Visit (INDEPENDENT_AMBULATORY_CARE_PROVIDER_SITE_OTHER): Payer: Medicare Other | Admitting: Cardiology

## 2013-05-21 ENCOUNTER — Encounter: Payer: Self-pay | Admitting: Cardiology

## 2013-05-21 VITALS — BP 110/68 | HR 63 | Ht 72.0 in | Wt 267.0 lb

## 2013-05-21 DIAGNOSIS — E7800 Pure hypercholesterolemia, unspecified: Secondary | ICD-10-CM

## 2013-05-21 DIAGNOSIS — I4891 Unspecified atrial fibrillation: Secondary | ICD-10-CM

## 2013-05-21 DIAGNOSIS — I714 Abdominal aortic aneurysm, without rupture, unspecified: Secondary | ICD-10-CM

## 2013-05-21 DIAGNOSIS — I2699 Other pulmonary embolism without acute cor pulmonale: Secondary | ICD-10-CM | POA: Insufficient documentation

## 2013-05-21 DIAGNOSIS — I2510 Atherosclerotic heart disease of native coronary artery without angina pectoris: Secondary | ICD-10-CM

## 2013-05-21 DIAGNOSIS — I5032 Chronic diastolic (congestive) heart failure: Secondary | ICD-10-CM

## 2013-05-21 DIAGNOSIS — I10 Essential (primary) hypertension: Secondary | ICD-10-CM

## 2013-05-21 MED ORDER — EZETIMIBE 10 MG PO TABS
10.0000 mg | ORAL_TABLET | Freq: Every day | ORAL | Status: DC
Start: 2013-05-21 — End: 2013-11-11

## 2013-05-21 NOTE — Assessment & Plan Note (Signed)
Patient had a recent pulmonary embolus. IVC filter in place. Not a Coumadin candidate. Management per primary care.

## 2013-05-21 NOTE — Assessment & Plan Note (Signed)
Continue statin. 

## 2013-05-21 NOTE — Patient Instructions (Addendum)
Your physician wants you to follow-up in: 3 MONTHS WITH DR CRENSHAW You will receive a reminder letter in the mail two months in advance. If you don't receive a letter, please call our office to schedule the follow-up appointment.   Your physician has requested that you have an abdominal aorta duplex. During this test, an ultrasound is used to evaluate the aorta. Allow 30 minutes for this exam. Do not eat after midnight the day before and avoid carbonated beverages  

## 2013-05-21 NOTE — Assessment & Plan Note (Signed)
He remains in permanent atrial fibrillation. Rate is controlled on no medications. Continue aspirin. Not a Coumadin candidate given recurrent falls.

## 2013-05-21 NOTE — Assessment & Plan Note (Signed)
Euvolemic on examination.Continue present dose of Lasix. 

## 2013-05-21 NOTE — Assessment & Plan Note (Signed)
Schedule follow-up ultrasound. 

## 2013-05-21 NOTE — Assessment & Plan Note (Signed)
Continue aspirin and statin. 

## 2013-05-21 NOTE — Assessment & Plan Note (Signed)
Blood pressure controlled. Continue present medications. 

## 2013-05-21 NOTE — Progress Notes (Signed)
HPI: FU coronary artery disease, status post coronary artery bypassing graft, small abdominal aortic aneurysm, and atrial fibrillation. His most recent catheterization was in March of 2002. This followed an abnormal Myoview which showed an ejection fraction of 53% and moderate-to-severe ischemia in the inferior and lateral walls from the apex to the base. His catheterization at that time showed total occlusion of his native vessels. The left internal mammary artery to the left anterior descending was patent, and the distal left anterior descending filled well. The distal left anterior descending also filled a marginal branch and a posterior descending artery. His ejection fraction was 50%. Note, the saphenous vein graft to the posterior descending artery and posterolateral was completely occluded, and the saphenous vein graft to the circumflex was completely occluded. He has been treated medically. He has also had problems with volume excess in the past treated with Lasix. Echocardiogram In April of 2012 showed normal LV function, moderate biatrial enlargement, mild right ventricular enlargement, mild mitral regurgitation. Holter monitor showed atrial fibrillation with bradycardia. However the episodes of bradycardia typically occurred in the early morning hours. Abdominal ultrasound in Jan of 2014 revealed a 3.0 x 3.3 cm aneurysm and followup was recommended in one year. I last saw him in October of 2014. Approximately 8 weeks ago he developed diarrhea and had to be admitted at [facility]. He apparently had a pulmonary embolus while hospitalized and had an IVC filter placed. He then developed pneumonia. He is slowly improving. He remains weak but denies dyspnea, chest pain, palpitations or syncope. He has fallen twice since our last saw him.  Current Outpatient Prescriptions  Medication Sig Dispense Refill  . Acetaminophen (TYLENOL ARTHRITIS EXT RELIEF PO) Take by mouth as needed.        Marland Kitchen.  aspirin 81 MG tablet 2 tabs po qd      . azelastine (ASTELIN) 137 MCG/SPRAY nasal spray Place 1 spray into the nose 2 (two) times daily. Use in each nostril as directed       . Calcium Carbonate-Vitamin D (CALCIUM + D) 600-200 MG-UNIT TABS Take 1 tablet by mouth.        . cetirizine (ZYRTEC) 10 MG tablet Take 10 mg by mouth daily.        . clopidogrel (PLAVIX) 75 MG tablet TAKE ONE (1) TABLET EACH DAY  30 tablet  12  . ezetimibe (ZETIA) 10 MG tablet Take 1 tablet (10 mg total) by mouth daily.  30 tablet  62  . Ferrous Sulfate (IRON) 325 (65 FE) MG TABS Take by mouth 2 (two) times daily.        Marland Kitchen. FLUoxetine (PROZAC) 20 MG tablet Take 20 mg by mouth daily.      . Fluticasone-Salmeterol (ADVAIR) 250-50 MCG/DOSE AEPB Inhale 1 puff into the lungs 2 (two) times daily.      . furosemide (LASIX) 40 MG tablet Take 1 tablet (40 mg total) by mouth daily.  60 tablet  6  . lansoprazole (PREVACID) 30 MG capsule Take 30 mg by mouth daily.       Marland Kitchen. levofloxacin (LEVAQUIN) 750 MG tablet Take 750 mg by mouth as directed.      Marland Kitchen. lisinopril-hydrochlorothiazide (PRINZIDE,ZESTORETIC) 20-25 MG per tablet Take 1 tablet by mouth daily.      . Multiple Vitamins-Minerals (MULTI COMPLETE PO) Take by mouth daily.        . simvastatin (ZOCOR) 80 MG tablet Take 40 mg by mouth at bedtime.       .Marland Kitchen  vitamin B-12 (CYANOCOBALAMIN) 500 MCG tablet Take 500 mcg by mouth daily.         No current facility-administered medications for this visit.     Past Medical History  Diagnosis Date  . Hypertension   . Coronary artery disease     a. s/p redo CABG 1988; cath 3/02: LAD, CFX and RCA occluded; S-PDA + PLBr of RCA + dCFX occluded, S-CFX occluded, L-LAD ok - treated medically  . Hyperlipidemia   . TIA (transient ischemic attack)     hx of  . Atrial fibrillation     no coumadin 2/2 falls  . Mobitz type 1 second degree AV block   . Diastolic congestive heart failure     echo 2/10: Ef 50-55%, in AK, sept HK, mild Asc aorta  dilataion, mild-mod BAE, mild LVH;   b. echo 4/12:  EF 60-65%, mild LVH, mod BAE, mild MR, PASP 45  . Abdominal aortic aneurysm     u/s 10/11: 3.5 x 3.5 cm;  Abdominal US 01/2012: Stable dimensions of juxtarenal AAA 3 x 3.3 cm-follow up 1 year    Past Surgical History  Procedure Laterality Date  . Coronary artery bypass graft  1977    with redo 1988  . Prostatectomy      History   Social History  . Marital Status: Married    Spouse Name: N/A    Number of Children: N/A  . Years of Education: N/A   Occupational History  . Not on file.   Social History Main Topics  . Smoking status: Never Smoker   . Smokeless tobacco: Current User    Types: Chew  . Alcohol Use: 0.6 oz/week    1 Glasses of wine per week  . Drug Use: No  . Sexual Activity: Not on file   Other Topics Concern  . Not on file   Social History Narrative  . No narrative on file    ROS: no fevers or chills, productive cough, hemoptysis, dysphasia, odynophagia, melena, hematochezia, dysuria, hematuria, rash, seizure activity, orthopnea, PND, pedal edema, claudication. Remaining systems are negative.  Physical Exam: Well-developed Frail in no acute distress.  Skin is warm and dry.  HEENT is normal.  Neck is supple.  Chest is clear to auscultation with normal expansion.  Cardiovascular exam is irregular Abdominal exam nontender or distended. No masses palpated. Extremities show no edema. neuro grossly intact  ECG Atrial fibrillation at a rate of 63. Nonspecific ST changes.

## 2013-05-29 ENCOUNTER — Observation Stay (HOSPITAL_COMMUNITY): Payer: Medicare Other

## 2013-05-29 ENCOUNTER — Ambulatory Visit: Payer: Medicare Other | Admitting: Physician Assistant

## 2013-05-29 ENCOUNTER — Encounter (HOSPITAL_COMMUNITY): Payer: Medicare Other

## 2013-05-29 ENCOUNTER — Observation Stay (HOSPITAL_COMMUNITY)
Admission: AD | Admit: 2013-05-29 | Discharge: 2013-05-30 | Disposition: A | Discharge status: Home / Self Care | Payer: Medicare Other | Source: Ambulatory Visit | Attending: Cardiology | Admitting: Cardiology

## 2013-05-29 ENCOUNTER — Encounter (HOSPITAL_COMMUNITY): Payer: Self-pay

## 2013-05-29 ENCOUNTER — Ambulatory Visit (INDEPENDENT_AMBULATORY_CARE_PROVIDER_SITE_OTHER): Payer: Medicare Other | Admitting: Physician Assistant

## 2013-05-29 ENCOUNTER — Encounter: Payer: Self-pay | Admitting: Physician Assistant

## 2013-05-29 VITALS — BP 70/50 | HR 66 | Ht 72.0 in | Wt 164.0 lb

## 2013-05-29 DIAGNOSIS — Z9181 History of falling: Secondary | ICD-10-CM | POA: Insufficient documentation

## 2013-05-29 DIAGNOSIS — I714 Abdominal aortic aneurysm, without rupture, unspecified: Secondary | ICD-10-CM | POA: Insufficient documentation

## 2013-05-29 DIAGNOSIS — IMO0002 Reserved for concepts with insufficient information to code with codable children: Secondary | ICD-10-CM | POA: Insufficient documentation

## 2013-05-29 DIAGNOSIS — I2699 Other pulmonary embolism without acute cor pulmonale: Secondary | ICD-10-CM

## 2013-05-29 DIAGNOSIS — I4891 Unspecified atrial fibrillation: Secondary | ICD-10-CM | POA: Insufficient documentation

## 2013-05-29 DIAGNOSIS — I10 Essential (primary) hypertension: Secondary | ICD-10-CM | POA: Insufficient documentation

## 2013-05-29 DIAGNOSIS — Z86711 Personal history of pulmonary embolism: Secondary | ICD-10-CM | POA: Insufficient documentation

## 2013-05-29 DIAGNOSIS — E860 Dehydration: Principal | ICD-10-CM | POA: Insufficient documentation

## 2013-05-29 DIAGNOSIS — R5383 Other fatigue: Secondary | ICD-10-CM

## 2013-05-29 DIAGNOSIS — I441 Atrioventricular block, second degree: Secondary | ICD-10-CM | POA: Insufficient documentation

## 2013-05-29 DIAGNOSIS — R634 Abnormal weight loss: Secondary | ICD-10-CM

## 2013-05-29 DIAGNOSIS — Z7982 Long term (current) use of aspirin: Secondary | ICD-10-CM | POA: Insufficient documentation

## 2013-05-29 DIAGNOSIS — I2510 Atherosclerotic heart disease of native coronary artery without angina pectoris: Secondary | ICD-10-CM | POA: Insufficient documentation

## 2013-05-29 DIAGNOSIS — H109 Unspecified conjunctivitis: Secondary | ICD-10-CM

## 2013-05-29 DIAGNOSIS — Z7902 Long term (current) use of antithrombotics/antiplatelets: Secondary | ICD-10-CM | POA: Insufficient documentation

## 2013-05-29 DIAGNOSIS — I5032 Chronic diastolic (congestive) heart failure: Secondary | ICD-10-CM | POA: Insufficient documentation

## 2013-05-29 DIAGNOSIS — I959 Hypotension, unspecified: Secondary | ICD-10-CM | POA: Insufficient documentation

## 2013-05-29 DIAGNOSIS — E7800 Pure hypercholesterolemia, unspecified: Secondary | ICD-10-CM | POA: Insufficient documentation

## 2013-05-29 DIAGNOSIS — I509 Heart failure, unspecified: Secondary | ICD-10-CM | POA: Insufficient documentation

## 2013-05-29 DIAGNOSIS — E785 Hyperlipidemia, unspecified: Secondary | ICD-10-CM | POA: Insufficient documentation

## 2013-05-29 DIAGNOSIS — Z951 Presence of aortocoronary bypass graft: Secondary | ICD-10-CM | POA: Insufficient documentation

## 2013-05-29 DIAGNOSIS — F17200 Nicotine dependence, unspecified, uncomplicated: Secondary | ICD-10-CM | POA: Insufficient documentation

## 2013-05-29 DIAGNOSIS — Z8673 Personal history of transient ischemic attack (TIA), and cerebral infarction without residual deficits: Secondary | ICD-10-CM | POA: Insufficient documentation

## 2013-05-29 LAB — CBC WITH DIFFERENTIAL/PLATELET
Basophils Absolute: 0 10*3/uL (ref 0.0–0.1)
Basophils Relative: 0 % (ref 0–1)
Eosinophils Absolute: 0.1 10*3/uL (ref 0.0–0.7)
Eosinophils Relative: 1 % (ref 0–5)
HCT: 41.3 % (ref 39.0–52.0)
Hemoglobin: 13.7 g/dL (ref 13.0–17.0)
LYMPHS ABS: 0.9 10*3/uL (ref 0.7–4.0)
LYMPHS PCT: 7 % — AB (ref 12–46)
MCH: 28.5 pg (ref 26.0–34.0)
MCHC: 33.2 g/dL (ref 30.0–36.0)
MCV: 86 fL (ref 78.0–100.0)
Monocytes Absolute: 0.7 10*3/uL (ref 0.1–1.0)
Monocytes Relative: 6 % (ref 3–12)
NEUTROS ABS: 10.3 10*3/uL — AB (ref 1.7–7.7)
Neutrophils Relative %: 86 % — ABNORMAL HIGH (ref 43–77)
PLATELETS: 471 10*3/uL — AB (ref 150–400)
RBC: 4.8 MIL/uL (ref 4.22–5.81)
RDW: 15.1 % (ref 11.5–15.5)
WBC: 12 10*3/uL — AB (ref 4.0–10.5)

## 2013-05-29 LAB — COMPREHENSIVE METABOLIC PANEL
ALK PHOS: 59 U/L (ref 39–117)
ALT: 32 U/L (ref 0–53)
AST: 23 U/L (ref 0–37)
Albumin: 3.6 g/dL (ref 3.5–5.2)
BUN: 51 mg/dL — ABNORMAL HIGH (ref 6–23)
CHLORIDE: 90 meq/L — AB (ref 96–112)
CO2: 29 mEq/L (ref 19–32)
Calcium: 9.8 mg/dL (ref 8.4–10.5)
Creatinine, Ser: 1.24 mg/dL (ref 0.50–1.35)
GFR calc non Af Amer: 50 mL/min — ABNORMAL LOW (ref >90)
GFR, EST AFRICAN AMERICAN: 58 mL/min — AB (ref >90)
GLUCOSE: 106 mg/dL — AB (ref 70–99)
POTASSIUM: 4.3 meq/L (ref 3.7–5.3)
Sodium: 134 mEq/L — ABNORMAL LOW (ref 137–147)
Total Bilirubin: 0.7 mg/dL (ref 0.3–1.2)
Total Protein: 6.7 g/dL (ref 6.0–8.3)

## 2013-05-29 LAB — TROPONIN I
Troponin I: <0.3 ng/mL (ref <0.30)
Troponin I: <0.3 ng/mL (ref <0.30)

## 2013-05-29 LAB — URINALYSIS, ROUTINE W REFLEX MICROSCOPIC
BILIRUBIN URINE: NEGATIVE
Glucose, UA: NEGATIVE mg/dL
HGB URINE DIPSTICK: NEGATIVE
KETONES UR: NEGATIVE mg/dL
Leukocytes, UA: NEGATIVE
Nitrite: NEGATIVE
PROTEIN: NEGATIVE mg/dL
SPECIFIC GRAVITY, URINE: 1.009 (ref 1.005–1.030)
UROBILINOGEN UA: 0.2 mg/dL (ref 0.0–1.0)
pH: 6 (ref 5.0–8.0)

## 2013-05-29 LAB — PROTIME-INR
INR: 0.98 (ref 0.00–1.49)
PROTHROMBIN TIME: 12.8 s (ref 11.6–15.2)

## 2013-05-29 LAB — TSH: TSH: 2.27 u[IU]/mL (ref 0.350–4.500)

## 2013-05-29 MED ORDER — FLUOXETINE HCL 20 MG PO TABS
20.0000 mg | ORAL_TABLET | Freq: Every day | ORAL | Status: DC
  Administered 2013-05-30: 20 mg via ORAL
  Filled 2013-05-29: qty 1

## 2013-05-29 MED ORDER — CYANOCOBALAMIN 500 MCG PO TABS
500.0000 ug | ORAL_TABLET | Freq: Every day | ORAL | Status: DC
  Administered 2013-05-30: 500 ug via ORAL
  Filled 2013-05-29: qty 1

## 2013-05-29 MED ORDER — EZETIMIBE 10 MG PO TABS
10.0000 mg | ORAL_TABLET | Freq: Every day | ORAL | Status: DC
  Administered 2013-05-30: 10 mg via ORAL
  Filled 2013-05-29: qty 1

## 2013-05-29 MED ORDER — CLOPIDOGREL BISULFATE 75 MG PO TABS
75.0000 mg | ORAL_TABLET | Freq: Every day | ORAL | Status: DC
  Administered 2013-05-30: 75 mg via ORAL
  Filled 2013-05-29 (×2): qty 1

## 2013-05-29 MED ORDER — AZELASTINE HCL 0.1 % NA SOLN
1.0000 | Freq: Two times a day (BID) | NASAL | Status: DC
  Administered 2013-05-29 – 2013-05-30 (×2): 1 via NASAL
  Filled 2013-05-29: qty 30

## 2013-05-29 MED ORDER — MOMETASONE FURO-FORMOTEROL FUM 100-5 MCG/ACT IN AERO
2.0000 | INHALATION_SPRAY | Freq: Two times a day (BID) | RESPIRATORY_TRACT | Status: DC
  Administered 2013-05-29 – 2013-05-30 (×2): 2 via RESPIRATORY_TRACT
  Filled 2013-05-29: qty 8.8

## 2013-05-29 MED ORDER — FERROUS SULFATE 325 (65 FE) MG PO TABS
325.0000 mg | ORAL_TABLET | Freq: Two times a day (BID) | ORAL | Status: DC
  Administered 2013-05-29 – 2013-05-30 (×3): 325 mg via ORAL
  Filled 2013-05-29 (×4): qty 1

## 2013-05-29 MED ORDER — SODIUM CHLORIDE 0.9 % IV SOLN
INTRAVENOUS | Status: DC
Start: 2013-05-29 — End: 2013-05-30
  Administered 2013-05-29 – 2013-05-30 (×2): 50 mL/h via INTRAVENOUS

## 2013-05-29 MED ORDER — ALBUTEROL SULFATE (2.5 MG/3ML) 0.083% IN NEBU
3.0000 mL | INHALATION_SOLUTION | RESPIRATORY_TRACT | Status: DC | PRN
Start: 2013-05-29 — End: 2013-05-30

## 2013-05-29 MED ORDER — LISINOPRIL-HYDROCHLOROTHIAZIDE 20-25 MG PO TABS: ORAL_TABLET | ORAL | Status: DC

## 2013-05-29 MED ORDER — ASPIRIN 81 MG PO CHEW
162.0000 mg | CHEWABLE_TABLET | Freq: Every day | ORAL | Status: DC
  Administered 2013-05-30: 162 mg via ORAL
  Filled 2013-05-29: qty 2

## 2013-05-29 MED ORDER — IRON 325 (65 FE) MG PO TABS: 325.0000 mg | ORAL_TABLET | Freq: Two times a day (BID) | ORAL | Status: DC

## 2013-05-29 MED ORDER — VITAMIN B-12 500 MCG PO TABS: 500.0000 ug | ORAL_TABLET | Freq: Every day | ORAL | Status: DC

## 2013-05-29 MED ORDER — ATORVASTATIN CALCIUM 40 MG PO TABS
40.0000 mg | ORAL_TABLET | Freq: Every day | ORAL | Status: DC
  Administered 2013-05-29 – 2013-05-30 (×2): 40 mg via ORAL
  Filled 2013-05-29 (×2): qty 1

## 2013-05-29 MED ORDER — POLYMYXIN B-TRIMETHOPRIM 10000-0.1 UNIT/ML-% OP SOLN
2.0000 [drp] | OPHTHALMIC | Status: DC
  Administered 2013-05-29 – 2013-05-30 (×3): 2 [drp] via OPHTHALMIC
  Filled 2013-05-29: qty 10

## 2013-05-29 MED ORDER — POLYMYXIN B-TRIMETHOPRIM 10000-0.1 UNIT/ML-% OP SOLN: 2.0000 [drp] | OPHTHALMIC | Status: AC

## 2013-05-29 MED ORDER — LORATADINE 10 MG PO TABS
10.0000 mg | ORAL_TABLET | Freq: Every day | ORAL | Status: DC
  Administered 2013-05-30: 10 mg via ORAL
  Filled 2013-05-29: qty 1

## 2013-05-29 MED ORDER — PANTOPRAZOLE SODIUM 40 MG PO TBEC
40.0000 mg | DELAYED_RELEASE_TABLET | Freq: Every day | ORAL | Status: DC
  Administered 2013-05-29 – 2013-05-30 (×2): 40 mg via ORAL
  Filled 2013-05-29 (×2): qty 1

## 2013-05-29 MED ORDER — ASPIRIN 81 MG PO TABS: 162.0000 mg | ORAL_TABLET | Freq: Every day | ORAL | Status: DC

## 2013-05-29 MED ORDER — NITROGLYCERIN 0.4 MG SL SUBL
0.4000 mg | SUBLINGUAL_TABLET | SUBLINGUAL | Status: DC | PRN
Start: 2013-05-29 — End: 2013-05-30

## 2013-05-29 MED ORDER — ENOXAPARIN SODIUM 40 MG/0.4ML ~~LOC~~ SOLN
40.0000 mg | SUBCUTANEOUS | Status: DC
  Administered 2013-05-29: 40 mg via SUBCUTANEOUS
  Filled 2013-05-29 (×2): qty 0.4

## 2013-05-29 NOTE — H&P (Signed)
History and Physical  Date:  05/29/2013   ID:  Philip Marinerharles Shimamoto Sr., DOB 04/06/1923, MRN [id number]  PCP:  Robb MatarHOMAS, MILLARD, MD  Cardiologist:  Dr. Olga MillersBrian Zophia Marrone      History of Present Illness: Philip MarinerCharles Hover Sr. is a 80 y.o. male with a hx of CAD, status post CABG, small AAA, atrial fibrillation, diastolic CHF. LHC 3/02 following abnormal Myoview (EF 53%, moderate to severe inferior and lateral ischemia): Total occlusion of native vessels, LIMA-LAD patent, distal LAD filled well, distal LAD filled marginal branch and PDA, SVG-PDA and PL occluded, SVG-circumflex occluded, EF 50%. Medical therapy has been recommended. Echo 05/2010: Normal LV function, moderate BAE, mild RVE, mild MR. Holter monitor 05/2010: Atrial fibrillation with episodes of bradycardia in the early morning hours. 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  Last seen by Dr. Jens Somrenshaw on 05/21/13.  He had recently been admitted to [facility] with diarrhea. He was diagnosed with pulmonary embolus and underwent IVC filter placement. Hospitalization was c/b pneumonia. Follow up was recommended in 3 months.  Patient has noted significant drop in his blood pressure over the last one to 2 weeks. Home health nurse comes out several times a week. Systolic blood pressures have ranged anywhere from 70 to 1-teens. He denies syncope or near-syncope. He feels weak at times. He has a long history of poor balance. His son thought that his breathing was labored. However, the patient denies any dyspnea. His weight in February was 170 pounds. He weighed in at 159 pounds today at home. He denies orthopnea, PND or edema. He denies chest pain.    Recent Labs: No results found for requested labs within last 365 days.  Wt Readings from Last 3 Encounters:  05/29/13 164 lb (74.39 kg)  05/21/13 267 lb (121.11 kg)  11/07/12 179 lb (81.194 kg)     Past Medical History  Diagnosis Date  . Hypertension   .  Coronary artery disease     a. s/p redo CABG 1988; cath 3/02: LAD, CFX and RCA occluded; S-PDA + PLBr of RCA + dCFX occluded, S-CFX occluded, L-LAD ok - treated medically  . Hyperlipidemia   . TIA (transient ischemic attack)     hx of  . Atrial fibrillation     no coumadin 2/2 falls  . Mobitz type 1 second degree AV block   . Diastolic congestive heart failure     echo 2/10: Ef 50-55%, in AK, sept HK, mild Asc aorta dilataion, mild-mod BAE, mild LVH;   b. echo 4/12:  EF 60-65%, mild LVH, mod BAE, mild MR, PASP 45  . 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 Acetaminophen (TYLENOL ARTHRITIS EXT RELIEF PO) Take by mouth as needed.        Marland Kitchen. aspirin 81 MG tablet 2 tabs po qd      . azelastine (ASTELIN) 137 MCG/SPRAY nasal spray Place 1 spray into the nose 2 (two) times daily. Use in each nostril as directed       . Calcium Carbonate-Vitamin D (CALCIUM + D) 600-200 MG-UNIT TABS Take 1 tablet by mouth.        . cetirizine (ZYRTEC) 10 MG tablet Take 10 mg by mouth daily.        . clopidogrel (  PLAVIX) 75 MG tablet TAKE ONE (1) TABLET EACH DAY  30 tablet  12  . ezetimibe (ZETIA) 10 MG tablet Take 1 tablet (10 mg total) by mouth daily.  30 tablet  62  . Ferrous Sulfate (IRON) 325 (65 FE) MG TABS Take by mouth 2 (two) times daily.        Marland Kitchen FLUoxetine (PROZAC) 20 MG tablet Take 20 mg by mouth daily.      . Fluticasone-Salmeterol (ADVAIR) 250-50 MCG/DOSE AEPB Inhale 1 puff into the lungs 2 (two) times daily.      . furosemide (LASIX) 40 MG tablet Take 1 tablet (40 mg total) by mouth daily.  60 tablet  6  . lansoprazole (PREVACID) 30 MG capsule Take 30 mg by mouth daily.       Marland Kitchen lisinopril-hydrochlorothiazide (PRINZIDE,ZESTORETIC) 20-25 MG per tablet Take 1 tablet by mouth daily.      . Multiple Vitamins-Minerals (MULTI COMPLETE PO)  Take by mouth daily.        Marland Kitchen PROAIR HFA 108 (90 BASE) MCG/ACT inhaler Inhale 1 puff into the lungs every 4 (four) hours as needed. PRN      . simvastatin (ZOCOR) 80 MG tablet Take 40 mg by mouth at bedtime.       . vitamin B-12 (CYANOCOBALAMIN) 500 MCG tablet Take 500 mcg by mouth daily.         No current facility-administered medications for this visit.    Allergies:   Review of patient's allergies indicates no known allergies.   Social History:  The patient  reports that he has never smoked. His smokeless tobacco use includes Chew. He reports that he drinks about .6 ounces of alcohol per week. He reports that he does not use illicit drugs.   Family History:  The patient's family history is not on file.   ROS:  Please see the history of present illness.   He denies any fevers, night sweats, vomiting, diarrhea, bleeding. He does have some drainage from his right eye as well as some redness and irritation.   All other systems reviewed and negative.   PHYSICAL EXAM: VS:  BP 70/50  Pulse 66  Ht 6' (1.829 m)  Wt 164 lb (74.39 kg)  BMI 22.24 kg/m2  SpO2 97% T 97 degrees F Well nourished, well developed, in no acute distress HEENT: conjunctival erythema noted in the right eye with clear to white discharge Neck: no JVD Cardiac:  normal S1, S2; RRR; no murmur Lungs:  clear to auscultation bilaterally, no wheezing, rhonchi or rales Abd: soft, nontender, no hepatomegaly Ext: no edema Skin: warm and dry Neuro:  CNs 2-12 intact, no focal abnormalities noted  EKG:  Atrial fibrillation, HR 66, normal axis, no change from prior tracing     ASSESSMENT AND PLAN:  1. Hypotension: Etiology not clear. The patient has recently been admitted to [facility] with significant illness to include, what sounds like, gastroenteritis, pneumonia and pulmonary embolism. He is now status post IVC filter.  His systolic pressure today is 60.  I initially planned to hold his medications, push fluids, check  labs and have him follow up in the next several days. However, the patient felt worse in the office. I reviewed his case today with Dr. Jens Som. We plan to admit him to the hospital for IV hydration and further evaluation.  Check CBC, BMET, LFTs, TSH, CXR, U/A, blood cultures.  Check Echo (assess RVF with recent PE). 2. CORONARY ATHEROSCLEROSIS NATIVE CORONARY ARTERY - no angina.  Continue aspirin, statin. 3. Atrial fibrillation - Permanent.  He is not a coumadin candidate.  Rate is controlled. 4. Pulmonary embolus - He is not a Coumadin candidate. IVC filter has been placed. 5. Weight loss: Etiology not clear. Obtain chest x-ray, CBC, basic metabolic panel, TSH, LFTs.   6. Chronic diastolic heart failure:  Hold Lasix and Lisinopril/HCT given low BP.  Watch fluid status closely. 7. HYPERCHOLESTEROLEMIA-PURE:  Continue statin. 8. ABDOMINAL AORTIC ANEURYSM - Followup abdominal US pending. 9. Conjunctivitis:  Treat with trimethoprim-polymyxin b (POLYTRIM) ophthalmic solution to use 4 times daily.   10. Disposition:  Admit to [facility] today.  Signed, Tereso NewcomerScott Weaver, PA-C  05/29/2013 10:40 AM   As above, patient seen and examined.Briefly he is an 80 year old male with past medical history of coronary artery disease status post coronary artery bypassing graft, atrial fibrillation not on Coumadin because of recurrent falls and diastolic congestive heart failure. He was recently [facility] following an admission for diarrhea which was complicated by pneumonia and a pulmonary embolus. He had an IVC filter placed. Over the preceding 3 days he has been noted to have low blood pressures with systolics in the 70s and 80s. The patient denies chest pain, palpitations, increased dyspnea, fevers, chills, productive cough, abdominal pain or dysuria. He has had weakness but no syncope. He states he has been eating well. His systolic blood pressure in the office was 65-70. Electrocardiogram shows  atrial fibrillation with no ST changes. We will admit and hold his Lasix and Lisinopril HCT. We will gently hydrate. Follow exam closely as he has a history of diastolic congestive heart failure. Check white blood cell count and chest x-ray. Check urinalysis and blood cultures. No signs or symptoms of infection. We will repeat echocardiogram given recent pulmonary embolus. IVC filter is in place and he hasn't been felt to be a Coumadin candidate given recurrent falls. Hopefully blood pressure will improve with the above measures. Lewayne BuntingBrian S Crystina Borrayo

## 2013-05-29 NOTE — Progress Notes (Signed)
[address] [place], [place]  [address] Phone: [phone] Fax:  [phone]  Date:  05/29/2013   ID:  Philip Marinerharles Steuck Sr., DOB 02/18/1923, MRN [id number]  PCP:  Robb MatarHOMAS, MILLARD, MD  Cardiologist:  Dr. Olga MillersBrian Crenshaw      History of Present Illness: Philip MarinerCharles Clifton Sr. is a 80 y.o. male with a hx of CAD, status post CABG, small AAA, atrial fibrillation, diastolic CHF. LHC 3/02 following abnormal Myoview (EF 53%, moderate to severe inferior and lateral ischemia): Total occlusion of native vessels, LIMA-LAD patent, distal LAD filled well, distal LAD filled marginal branch and PDA, SVG-PDA and PL occluded, SVG-circumflex occluded, EF 50%. Medical therapy has been recommended. Echo 05/2010: Normal LV function, moderate BAE, mild RVE, mild MR. Holter monitor 05/2010: Atrial fibrillation with episodes of bradycardia in the early morning hours. 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  Last seen by Dr. Jens Somrenshaw on 05/21/13.  He had recently been admitted to [facility] with diarrhea. He was diagnosed with pulmonary embolus and underwent IVC filter placement. Hospitalization was c/b pneumonia. Follow up was recommended in 3 months.  Patient has noted significant drop in his blood pressure over the last one to 2 weeks. Home health nurse comes out several times a week. Systolic blood pressures have ranged anywhere from 70 to 1-teens. He denies syncope or near-syncope. He feels weak at times. He has a long history of poor balance. His son thought that his breathing was labored. However, the patient denies any dyspnea. His weight in February was 170 pounds. He weighed in at 159 pounds today at home. He denies orthopnea, PND or edema. He denies chest pain.    Recent Labs: No results found for requested labs within last 365 days.  Wt Readings from Last 3 Encounters:  05/29/13 164 lb (74.39 kg)  05/21/13 267 lb (121.11 kg)  11/07/12 179 lb (81.194  kg)     Past Medical History  Diagnosis Date  . Hypertension   . Coronary artery disease     a. s/p redo CABG 1988; cath 3/02: LAD, CFX and RCA occluded; S-PDA + PLBr of RCA + dCFX occluded, S-CFX occluded, L-LAD ok - treated medically  . Hyperlipidemia   . TIA (transient ischemic attack)     hx of  . Atrial fibrillation     no coumadin 2/2 falls  . Mobitz type 1 second degree AV block   . Diastolic congestive heart failure     echo 2/10: Ef 50-55%, in AK, sept HK, mild Asc aorta dilataion, mild-mod BAE, mild LVH;   b. echo 4/12:  EF 60-65%, mild LVH, mod BAE, mild MR, PASP 45  . 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 Acetaminophen (TYLENOL ARTHRITIS EXT RELIEF PO) Take by mouth as needed.        Marland Kitchen. aspirin 81 MG tablet 2 tabs po qd      . azelastine (ASTELIN) 137 MCG/SPRAY nasal spray Place 1 spray into the nose 2 (two) times daily. Use in each nostril as directed       . Calcium Carbonate-Vitamin D (CALCIUM + D) 600-200 MG-UNIT TABS Take 1 tablet by mouth.        . cetirizine (ZYRTEC) 10 MG tablet Take  10 mg by mouth daily.        . clopidogrel (PLAVIX) 75 MG tablet TAKE ONE (1) TABLET EACH DAY  30 tablet  12  . ezetimibe (ZETIA) 10 MG tablet Take 1 tablet (10 mg total) by mouth daily.  30 tablet  62  . Ferrous Sulfate (IRON) 325 (65 FE) MG TABS Take by mouth 2 (two) times daily.        Marland Kitchen FLUoxetine (PROZAC) 20 MG tablet Take 20 mg by mouth daily.      . Fluticasone-Salmeterol (ADVAIR) 250-50 MCG/DOSE AEPB Inhale 1 puff into the lungs 2 (two) times daily.      . furosemide (LASIX) 40 MG tablet Take 1 tablet (40 mg total) by mouth daily.  60 tablet  6  . lansoprazole (PREVACID) 30 MG capsule Take 30 mg by mouth daily.       Marland Kitchen lisinopril-hydrochlorothiazide (PRINZIDE,ZESTORETIC) 20-25 MG per tablet Take 1 tablet  by mouth daily.      . Multiple Vitamins-Minerals (MULTI COMPLETE PO) Take by mouth daily.        Marland Kitchen PROAIR HFA 108 (90 BASE) MCG/ACT inhaler Inhale 1 puff into the lungs every 4 (four) hours as needed. PRN      . simvastatin (ZOCOR) 80 MG tablet Take 40 mg by mouth at bedtime.       . vitamin B-12 (CYANOCOBALAMIN) 500 MCG tablet Take 500 mcg by mouth daily.         No current facility-administered medications for this visit.    Allergies:   Review of patient's allergies indicates no known allergies.   Social History:  The patient  reports that he has never smoked. His smokeless tobacco use includes Chew. He reports that he drinks about .6 ounces of alcohol per week. He reports that he does not use illicit drugs.   Family History:  The patient's family history is not on file.   ROS:  Please see the history of present illness.   He denies any fevers, night sweats, vomiting, diarrhea, bleeding. He does have some drainage from his right eye as well as some redness and irritation.   All other systems reviewed and negative.   PHYSICAL EXAM: VS:  BP 70/50  Pulse 66  Ht 6' (1.829 m)  Wt 164 lb (74.39 kg)  BMI 22.24 kg/m2  SpO2 97% T 97 degrees F Well nourished, well developed, in no acute distress HEENT: conjunctival erythema noted in the right eye with clear to white discharge Neck: no JVD Cardiac:  normal S1, S2; RRR; no murmur Lungs:  clear to auscultation bilaterally, no wheezing, rhonchi or rales Abd: soft, nontender, no hepatomegaly Ext: no edema Skin: warm and dry Neuro:  CNs 2-12 intact, no focal abnormalities noted  EKG:  Atrial fibrillation, HR 66, normal axis, no change from prior tracing     ASSESSMENT AND PLAN:  1. Hypotension: Etiology not clear. The patient has recently been admitted to [facility] with significant illness to include, what sounds like, gastroenteritis, pneumonia and pulmonary embolism. He is now status post IVC filter.  His systolic pressure today  is 60.  I initially planned to hold his medications, push fluids, check labs and have him follow up in the next several days. However, the patient felt worse in the office. I reviewed his case today with Dr. Jens Som. We plan to admit him to the hospital for IV hydration and further evaluation.  Check CBC, BMET, LFTs, TSH, CXR, U/A, blood cultures.  Check Echo (  assess RVF with recent PE). 2. CORONARY ATHEROSCLEROSIS NATIVE CORONARY ARTERY - no angina. Continue aspirin, statin. 3. Atrial fibrillation - Permanent.  He is not a coumadin candidate.  Rate is controlled. 4. Pulmonary embolus - He is not a Coumadin candidate. IVC filter has been placed. 5. Weight loss: Etiology not clear. Obtain chest x-ray, CBC, basic metabolic panel, TSH, LFTs.   6. Chronic diastolic heart failure:  Hold Lasix and Lisinopril/HCT given low BP.  Watch fluid status closely. 7. HYPERCHOLESTEROLEMIA-PURE:  Continue statin. 8. ABDOMINAL AORTIC ANEURYSM - Followup abdominal US pending. 9. Conjunctivitis:  Treat with trimethoprim-polymyxin b (POLYTRIM) ophthalmic solution to use 4 times daily.   10. Disposition:  Admit to [facility] today.  Signed, Tereso NewcomerScott Philomena Buttermore, PA-C  05/29/2013 10:40 AM

## 2013-05-29 NOTE — Patient Instructions (Signed)
PER SCOTT WEAVER, PAC TO HOLD LASIX; ALSO HOLD LISINOPRIL/HCTZ  LAB WORK TODAY AND CHEST X-RAY OVER AT THE [facility] ON [address]  MAKE SURE TO DRINK PLENTY OF FLUIDS; WATER, JUICE; LIMIT CAFFEINE  MAKE SURE TO FOLLOW UP WITH PRIMARY CARE NEXT WEEK ABOUT CONJUNCTIVITIS AND WEIGHT LOSS  MAKE SURE TO HAVE THE HOME HEALTH NURSE GET YOUR BLOOD PRESSURE AT THEIR NEXT VISIT WITH YOU AND CALL US WITH THE READING [phone]  MAKE SURE TO WEIGH DAILY AND CALL IF WEIGHT IS UP BY 3 LB'S X 1 DAY [phone] SCOTT WEAVER,,PAC  YOU HAVE A FOLLOW UP WITH SCOTT WEAVER, [facility] ON  06/03/13 @ 3 PM

## 2013-05-30 DIAGNOSIS — R5381 Other malaise: Secondary | ICD-10-CM

## 2013-05-30 DIAGNOSIS — I2510 Atherosclerotic heart disease of native coronary artery without angina pectoris: Secondary | ICD-10-CM

## 2013-05-30 DIAGNOSIS — I4891 Unspecified atrial fibrillation: Secondary | ICD-10-CM

## 2013-05-30 DIAGNOSIS — R5383 Other fatigue: Secondary | ICD-10-CM

## 2013-05-30 DIAGNOSIS — I517 Cardiomegaly: Secondary | ICD-10-CM

## 2013-05-30 DIAGNOSIS — I2699 Other pulmonary embolism without acute cor pulmonale: Secondary | ICD-10-CM

## 2013-05-30 LAB — TROPONIN I: Troponin I: <0.3 ng/mL (ref <0.30)

## 2013-05-30 NOTE — Progress Notes (Signed)
UR Completed.  Philip Flores Philip Flores [phone] 05/30/2013  

## 2013-05-30 NOTE — Progress Notes (Signed)
A 80 year old am with long-standing cardiac history of CABG at age 80 with A. fib not thought to be a, candidate for warfarin. He also recent PE with pneumonia treated at [facility]. An IVC filter placed. About a month ago started having significant diarrhea and has had several hospital stays afebrile in the hospital most recently complicated by pneumonia and PE. Is feeling a little weak and lethargic over the last few weeks and was seen by Tereso NewcomerScott Weaver, PA-C. in conjunction with Clovis RileyBryan Crenshaw yesterday in clinic. He was noted to be hypotensive with blood pressures in the 70s to 80s. He denies any sensations of chest discomfort, palpitations, increased dyspnea. No fevers chills or productive cough. His abdominal discomfort and diarrhea has resolved. Due to the hypotension, he was admitted for hydration and evaluation for possible infectious etiology.   Subjective:  He says he feels much better today after getting some hydration. Will be nauseated this morning while on the commode, better now. No dyspnea no dizziness. He is currently eating breakfast, and feels much better now. UA checked, negative. Mildly elevated white blood cell count, but no active signs of infection.  Objective:  Vital Signs in the last 24 hours: Temp:  [97.2 F (36.2 C)-97.6 F (36.4 C)] 97.6 F (36.4 C) (05/01 0504) Pulse Rate:  [58-66] 64 (05/01 0504) Resp:  [18] 18 (05/01 0504) BP: (70-111)/(42-50) 111/47 mmHg (05/01 0504) SpO2:  [95 %-99 %] 97 % (05/01 0504) Weight:  [160 lb 0.9 oz (72.6 kg)-165 lb 12.6 oz (75.2 kg)] 160 lb 0.9 oz (72.6 kg) (05/01 0504)  Intake/Output from previous day: 04/30 0701 - 05/01 0700 In: 240 [P.O.:240] Out: 1500 [Urine:1500] Intake/Output from this shift:   Physical Exam: General appearance: alert, cooperative, appears stated age and no distress HEENT: [place]/AT, EOMI, MMM, anicteric sclera; mild right eye conjunctival erythema. Tearing, but no significant discharge Neck: no  adenopathy, no carotid bruit, no JVD and supple, symmetrical, trachea midline Lungs: clear to auscultation bilaterally, normal percussion bilaterally and Nonlabored, good air movement Heart: Rate controlled irregular rhythm; no clear M/R./G. Abdomen: soft, non-tender; bowel sounds normal; no masses,  no organomegaly Extremities: extremities normal, atraumatic, no cyanosis or edema Pulses: 2+ and symmetric Neurologic: Grossly normal  Lab Results:  Recent Labs  05/29/13 1455  WBC 12.0*  HGB 13.7  PLT 471*    Recent Labs  05/29/13 1455  NA 134*  K 4.3  CL 90*  CO2 29  GLUCOSE 106*  BUN 51*  CREATININE 1.24    Recent Labs  05/29/13 2123 05/30/13 0340  TROPONINI <0.30 <0.30   Hepatic Function Panel  Recent Labs  05/29/13 1455  PROT 6.7  ALBUMIN 3.6  AST 23  ALT 32  ALKPHOS 59  BILITOT 0.7   No results found for this basename: CHOL,  in the last 72 hours No results found for this basename: PROTIME,  in the last 72 hours  Imaging: Chest x-ray reviewed: No acute findings  Cardiac Studies: Echocardiogram pending  Assessment/Plan:  Principal Problem:   Hypotension Active Problems:   ATRIAL FIBRILLATION - not on anticoagulation secondary to falls   CORONARY ATHEROSCLEROSIS NATIVE CORONARY ARTERY   Chronic diastolic heart failure   Pulmonary embolus - status post IVC filter; not on Coumadin  Mr. Georgina QuintGlass feels much better today after hydration. I think some of his issues may simply have been secondary to dehydration as he feels much better after IV fluids.  So far no clear infectious etiology is identified. He is  not having any more diarrhea. He is eating and drinking well without any difficulty. He did feel much better after hydration.  Echocardiogram is pending.  Plan for today is to await the results of the echocardiogram. He'll have him ambulate the hallway with assistance to assess his stability; check orthostatic pressures. Continue IV fluids until echo  complete. Continue home medications with the exception of the Zestoretic  If he remains stable throughout the course of the day and able to ambulate without difficulty. Plan would potentially be to per the patient for discharge later on today not on ACE inhibitor / diuretic. Would allow for some aggressive hypertension that he has no symptoms of heart diet. Currently he is not noting any symptoms of heart failure.    LOS: 1 day    Marykay LexDavid W Harding 05/30/2013, 8:48 AM

## 2013-05-30 NOTE — Discharge Instructions (Signed)
Call if any weakness.  Stop Lisinopril HCTZ  regular diet  I cancelled your Echo of your heart in the office next week.  Keep appt with Tereso NewcomerScott Weaver, [facility]

## 2013-05-30 NOTE — Progress Notes (Signed)
  Echocardiogram 2D Echocardiogram has been performed.  Arvil ChacoRachel Foster 05/30/2013, 10:37 AM

## 2013-05-30 NOTE — Discharge Summary (Signed)
Physician Discharge Summary       Patient ID: Philip Marinerharles Cho Sr. MRN: [id number] DOB/AGE: 78/06/1923 78 y.o.  Admit date: 05/29/2013 Discharge date: 05/30/2013  Discharge Diagnoses:  Principal Problem:   Hypotension, secondary to dehydration Active Problems:   CORONARY ATHEROSCLEROSIS NATIVE CORONARY ARTERY   ATRIAL FIBRILLATION - not on anticoagulation secondary to falls   Chronic diastolic heart failure   Pulmonary embolus - status post IVC filter; not on Coumadin   Discharged Condition: good  Primary Cardiologist: Dr. Jens Somrenshaw  Procedures: none  Hospital Course: [age] year old am with long-standing cardiac history of CABG at age 89 with A. fib not thought to be a, candidate for warfarin. He also had recent PE with pneumonia [facility] at [facility]. An IVC filter placed. About a month ago started having significant diarrhea and has had several hospital stays- afebrile in the hospital most recently complicated by pneumonia and PE. Is feeling a little weak and lethargic over the last few weeks and was seen by Tereso NewcomerScott Weaver, PA-C. in conjunction with Philip Flores 05/29/13 in clinic. He was noted to be hypotensive with blood pressures in the 70s to 80s. He denied any sensations of chest discomfort, palpitations, increased dyspnea. No fevers chills or productive cough. His abdominal discomfort and diarrhea has resolved. Due to the hypotension, he was admitted for hydration and evaluation for possible infectious etiology.  He was hydrated overnight and today.  He did have nausea this AM while on commode but resolved quickly.  No sign of infection, U/A negative, no fever, mild elevation of WBC at 12.  No active sign of infection.    Pt was seen and examined by Dr. Herbie BaltimoreHarding and found stable for discharge.  BP soft at 90 systolic but pt walking in halls without any symptoms.  No diarrhea.  His appetite is good. Pt will keep his appt. with Tereso NewcomerScott Weaver next week.  We are holding lisinopril  hctz.   ECHO: 05/30/13 Left ventricle: The cavity size was normal. Wall thickness was normal. Systolic function was normal. The estimated ejection fraction was in the range of 55% to 60%. Basal to mid inferior severe hypokinesis to akinesis. Indeterminant diastolic function (atrial fibrillation). - Aortic valve: Trileaflet; moderately calcified leaflets.  Sclerosis without stenosis. - Aorta: Mildly dilated aortic root. Aortic root dimension: 41mm (ED). - Mitral valve: Moderately calcified annulus. Mildly calcified leaflets . Trivial regurgitation. - Left atrium: The atrium was mildly to moderately dilated. - Right ventricle: The cavity size was normal. Systolic function was mildly reduced. - Right atrium: The atrium was mildly to moderately dilated. - Pulmonary arteries: No complete TR doppler jet so unable to estimate PA systolic pressure. Impressions: - Normal LV size and systolic function, EF 55-60%. Basal to mid inferior severe hypokinesis to akinesis. Normal RV size with mildly decreased systolic function. Aortic sclerosis without significant stenosis.  Consults: none  Significant Diagnostic Studies:  BMET    Component Value Date/Time   NA 134* 05/29/2013 1455   K 4.3 05/29/2013 1455   CL 90* 05/29/2013 1455   CO2 29 05/29/2013 1455   GLUCOSE 106* 05/29/2013 1455   BUN 51* 05/29/2013 1455   CREATININE 1.24 05/29/2013 1455   CALCIUM 9.8 05/29/2013 1455   GFRNONAA 50* 05/29/2013 1455   GFRAA 58* 05/29/2013 1455    CBC    Component Value Date/Time   WBC 12.0* 05/29/2013 1455   RBC 4.80 05/29/2013 1455   HGB 13.7 05/29/2013 1455   HCT 41.3 05/29/2013 1455   PLT 471*  05/29/2013 1455   MCV 86.0 05/29/2013 1455   MCH 28.5 05/29/2013 1455   MCHC 33.2 05/29/2013 1455   RDW 15.1 05/29/2013 1455   LYMPHSABS 0.9 05/29/2013 1455   MONOABS 0.7 05/29/2013 1455   EOSABS 0.1 05/29/2013 1455   BASOSABS 0.0 05/29/2013 1455   Troponin <0.30 X3  TSH 2.270 Blood cultures  pending   CHEST 2  VIEW: COMPARISON: Chest x-ray and chest CT dated 05/12/2013 FINDINGS: Heart size and pulmonary vascularity are at the upper limits of normal. There is tortuosity and calcification of the thoracic aorta. Prior CABG. The lungs are clear. No effusions. No acute osseous abnormality. IMPRESSION: No acute abnormalities.   Discharge Exam: Blood pressure 90/62, pulse 67, temperature 97.5 F (36.4 C), temperature source Oral, resp. rate 18, height 6' (1.829 m), weight 160 lb 0.9 oz (72.6 kg), SpO2 95.00%.  Disposition:        Future Appointments Provider Department Dept Phone   06/03/2013 3:00 PM Beatrice LecherScott T Weaver, PA-C [facility] [address] Office [phone]   06/04/2013 8:00 AM [facility] Echo Pv 5 [facility] [address] [phone]       Medication List    STOP taking these medications       lisinopril-hydrochlorothiazide 20-25 MG per tablet  Commonly known as:  PRINZIDE,ZESTORETIC      TAKE these medications       aspirin 81 MG tablet  Take 162 mg by mouth daily.     azelastine 0.1 % nasal spray  Commonly known as:  ASTELIN  Place 1 spray into the nose 2 (two) times daily. Use in each nostril as directed     Calcium Carbonate-Vitamin D 600-200 MG-UNIT Tabs  Take 1 tablet by mouth.     cetirizine 10 MG tablet  Commonly known as:  ZYRTEC  Take 10 mg by mouth daily.     clopidogrel 75 MG tablet  Commonly known as:  PLAVIX  Take 75 mg by mouth daily with breakfast.     ezetimibe 10 MG tablet  Commonly known as:  ZETIA  Take 1 tablet (10 mg total) by mouth daily.     FLUoxetine 20 MG tablet  Commonly known as:  PROZAC  Take 20 mg by mouth daily.     Fluticasone-Salmeterol 250-50 MCG/DOSE Aepb  Commonly known as:  ADVAIR  Inhale 1 puff into the lungs 2 (two) times daily.     Iron 325 (65 FE) MG Tabs  Take 1 tablet by mouth 2 (two) times daily.     lansoprazole 30 MG capsule  Commonly known as:  PREVACID  Take 30 mg by mouth daily.     MULTI  COMPLETE PO  Take 1 tablet by mouth daily.     PROAIR HFA 108 (90 BASE) MCG/ACT inhaler  Generic drug:  albuterol  Inhale 1 puff into the lungs every 4 (four) hours as needed for wheezing. PRN     simvastatin 80 MG tablet  Commonly known as:  ZOCOR  Take 40 mg by mouth at bedtime.     trimethoprim-polymyxin b ophthalmic solution  Commonly known as:  POLYTRIM  Place 2 drops into the right eye every 4 (four) hours.     vitamin B-12 500 MCG tablet  Commonly known as:  CYANOCOBALAMIN  Take 500 mcg by mouth daily.          Discharge Instructions: Call if any weakness.  Stop Lisinopril HCTZ  regular diet  I cancelled your Echo of your  heart in the office next week.  Keep appt with Tereso Newcomer, PAC   Signed: Nada Boozer Nurse Practitioner-Certified [facility]: [facility] 05/30/2013, 6:01 PM  Time spent on discharge : 35 minutes.    I saw and evaluated the patient later this afternoon after his initial rounding visit. He did well all day long and in the hall without difficulty. He was eating and drinking without any problem. No nausea no dizziness no orthostatic symptoms.  We have held his lisinopril which I would recommend holding indefinitely for now. Recommendation will be to only use his diuretic on a when necessary basis based on his weight change. I told to use his weight when he arrives at home. He has an appointment with the sliding scale Lasix concept. Will simply not use a standing dose.  I think he was quite stable for discharge and was on very much wanting to go home.  I agree with the discharge summary as noted above  Marykay Lex, M.D., M.S. Interventional Cardiologist  [facility] Pager # [phone] 05/30/2013

## 2013-06-03 ENCOUNTER — Encounter: Payer: Self-pay | Admitting: Physician Assistant

## 2013-06-03 ENCOUNTER — Ambulatory Visit (INDEPENDENT_AMBULATORY_CARE_PROVIDER_SITE_OTHER): Payer: Medicare Other | Admitting: Physician Assistant

## 2013-06-03 VITALS — BP 100/60 | HR 70 | Ht 72.0 in | Wt 169.0 lb

## 2013-06-03 DIAGNOSIS — I2510 Atherosclerotic heart disease of native coronary artery without angina pectoris: Secondary | ICD-10-CM

## 2013-06-03 DIAGNOSIS — I2699 Other pulmonary embolism without acute cor pulmonale: Secondary | ICD-10-CM

## 2013-06-03 DIAGNOSIS — I4891 Unspecified atrial fibrillation: Secondary | ICD-10-CM

## 2013-06-03 DIAGNOSIS — I714 Abdominal aortic aneurysm, without rupture, unspecified: Secondary | ICD-10-CM

## 2013-06-03 DIAGNOSIS — I10 Essential (primary) hypertension: Secondary | ICD-10-CM

## 2013-06-03 DIAGNOSIS — I5032 Chronic diastolic (congestive) heart failure: Secondary | ICD-10-CM

## 2013-06-03 MED ORDER — FUROSEMIDE 40 MG PO TABS: 40.0000 mg | ORAL_TABLET | Freq: Every day | ORAL | Status: DC | PRN

## 2013-06-03 NOTE — Progress Notes (Signed)
[address] [place], [place]  [address] Phone: [phone] Fax:  [phone]  Date:  06/03/2013   ID:  Philip Marinerharles Huffaker Sr., DOB 10/11/1923, MRN [id number]  PCP:  Robb MatarHOMAS, MILLARD, MD  Cardiologist:  Dr. Olga MillersBrian Crenshaw      History of Present Illness: Philip MarinerCharles Mcevers Sr. is a 80 y.o. male with a hx of CAD, status post CABG, small AAA, atrial fibrillation, diastolic CHF. LHC 3/02 following abnormal Myoview (EF 53%, moderate to severe inferior and lateral ischemia): Total occlusion of native vessels, LIMA-LAD patent, distal LAD filled well, distal LAD filled marginal branch and PDA, SVG-PDA and PL occluded, SVG-circumflex occluded, EF 50%. Medical therapy has been recommended. Echo 05/2010: Normal LV function, moderate BAE, mild RVE, mild MR. Holter monitor 05/2010: Atrial fibrillation with episodes of bradycardia in the early morning hours. 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   He had recently been [facility] with diarrhea. He was diagnosed with pulmonary embolus and underwent IVC filter placement. Hospitalization was c/b pneumonia. Follow up was recommended in 3 months.  He was seen in the office 05/29/13 with symptomatic hypotension (SBP 60). He was admitted to the hospital and hydrated with IV fluids. Workup was negative without signs of infection. Medications were held. Echocardiogram demonstrated normal LV function. He returns today for follow up.  He is here with his son.  He is feeling better.  BPs at home have been good.  He denies chest pain, dyspnea, syncope, orthopnea, PND, edema.    Studies:  - Echo (05/30/13):  EF 55-60%, inferior HK to akinesis, mildly dilated aortic root (41 mm), MAC, trivial MR, mild to moderate LAE, mildly reduced RVSF, mild to moderate RAE   Recent Labs: 05/29/2013: Alkaline Phosphatase 59; ALT 32; AST 23; BUN 51*; Creatinine 1.24; HCT 41.3; Hemoglobin 13.7; Platelets 471*; Potassium 4.3;  Sodium 134*; TSH 2.270; WBC 12.0*   Wt Readings from Last 3 Encounters:  05/30/13 160 lb 0.9 oz (72.6 kg)  05/29/13 164 lb (74.39 kg)  05/21/13 267 lb (121.11 kg)     Past Medical History  Diagnosis Date  . Hypertension   . Coronary artery disease     a. s/p redo CABG 1988; cath 3/02: LAD, CFX and RCA occluded; S-PDA + PLBr of RCA + dCFX occluded, S-CFX occluded, L-LAD ok - treated medically  . Hyperlipidemia   . TIA (transient ischemic attack)     hx of  . Atrial fibrillation     no coumadin 2/2 falls  . Mobitz type 1 second degree AV block   . Diastolic congestive heart failure     echo 2/10: Ef 50-55%, in AK, sept HK, mild Asc aorta dilataion, mild-mod BAE, mild LVH;   b. echo 4/12:  EF 60-65%, mild LVH, mod BAE, mild MR, PASP 45  . 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 aspirin 81 MG tablet Take 162 mg by mouth daily.       Marland Kitchen. azelastine (ASTELIN) 137 MCG/SPRAY nasal spray Place 1 spray into the nose 2 (two) times daily. Use in each nostril as directed       . Calcium Carbonate-Vitamin D (CALCIUM + D) 600-200 MG-UNIT TABS Take 1 tablet by mouth.        .Marland Kitchen  cetirizine (ZYRTEC) 10 MG tablet Take 10 mg by mouth daily.        . clopidogrel (PLAVIX) 75 MG tablet Take 75 mg by mouth daily with breakfast.      . ezetimibe (ZETIA) 10 MG tablet Take 1 tablet (10 mg total) by mouth daily.  30 tablet  62  . Ferrous Sulfate (IRON) 325 (65 FE) MG TABS Take 1 tablet by mouth 2 (two) times daily.       Marland Kitchen. FLUoxetine (PROZAC) 20 MG tablet Take 20 mg by mouth daily.      . Fluticasone-Salmeterol (ADVAIR) 250-50 MCG/DOSE AEPB Inhale 1 puff into the lungs 2 (two) times daily.      . furosemide (LASIX) 40 MG tablet Take 1 tablet (40 mg total) by mouth daily as needed (if weight increases 3 lbs in one day (5 lbs in one week),  swelling, shortness of breath).  30 tablet    . lansoprazole (PREVACID) 30 MG capsule Take 30 mg by mouth daily.       . Multiple Vitamins-Minerals (MULTI COMPLETE PO) Take 1 tablet by mouth daily.       Marland Kitchen. PROAIR HFA 108 (90 BASE) MCG/ACT inhaler Inhale 1 puff into the lungs every 4 (four) hours as needed for wheezing. PRN      . simvastatin (ZOCOR) 80 MG tablet Take 40 mg by mouth at bedtime.       . traMADol (ULTRAM) 50 MG tablet Take 50 mg by mouth every 6 (six) hours as needed.       . trimethoprim-polymyxin b (POLYTRIM) ophthalmic solution Place 2 drops into the right eye every 4 (four) hours.  10 mL  0  . vitamin B-12 (CYANOCOBALAMIN) 500 MCG tablet Take 500 mcg by mouth daily.         No current facility-administered medications for this visit.    Allergies:   Review of patient's allergies indicates no known allergies.   Social History:  The patient  reports that he has never smoked. His smokeless tobacco use includes Chew. He reports that he drinks about .6 ounces of alcohol per week. He reports that he does not use illicit drugs.   Family History:  The patient's family history is not on file.   ROS:  Please see the history of present illness.   All other systems reviewed and negative.   PHYSICAL EXAM: VS:  BP 100/60  Pulse 70  Ht 6' (1.829 m)  Wt 169 lb (76.658 kg)  BMI 22.92 kg/m2   Well nourished, well developed, in no acute distress HEENT: normal Neck: no JVD Cardiac:  normal S1, S2; RRR; no murmur Lungs:  clear to auscultation bilaterally, no wheezing, rhonchi or rales Abd: soft, nontender, no hepatomegaly Ext: no edema Skin: warm and dry Neuro:  CNs 2-12 intact, no focal abnormalities noted  EKG:  Atrial fibrillation, HR 70, normal axis, no change from prior tracing     ASSESSMENT AND PLAN:  1. Hypertension:  Hypotension is resolved. BP is optimal now.  Continue to remain off of Lisinopril/HCT.   2. CORONARY ATHEROSCLEROSIS NATIVE CORONARY ARTERY - no angina.  Continue aspirin, statin. 3. Atrial fibrillation - Permanent.  He is not a coumadin candidate.  Rate is controlled. 4. Pulmonary embolus - He is not a Coumadin candidate. IVC filter has been placed. 5. Weight loss: Etiology not clear. Recent workup negative.  f/u with PCP. 6. Chronic diastolic heart failure:  He should remain on Lasix prn for now.  We  discussed the importance of daily weights.  He understands when to take Lasix.   7. HYPERCHOLESTEROLEMIA-PURE:  Continue statin. 8. ABDOMINAL AORTIC ANEURYSM - Followup abdominal US pending. 9. Disposition:  F/u with Dr. Olga Millers in 2 mos.  Signed, Tereso Newcomer, PA-C  06/03/2013 2:49 PM

## 2013-06-03 NOTE — Patient Instructions (Addendum)
Weigh daily and take Lasix if:  Weight up 3 lbs in one day (5 lbs in one week), increased swelling or increased shortness of breath.   YOU HAVE A FOLLOW UP WITH DR. CRENSHAW ON 08/07/13 @ 4:15 AT OUR [place] OFFICE WHERE [facility] LOCATED [facility].

## 2013-06-04 ENCOUNTER — Telehealth: Payer: Self-pay | Admitting: *Deleted

## 2013-06-04 ENCOUNTER — Ambulatory Visit (HOSPITAL_COMMUNITY): Payer: Medicare Other | Attending: Cardiology | Admitting: *Deleted

## 2013-06-04 DIAGNOSIS — Z8673 Personal history of transient ischemic attack (TIA), and cerebral infarction without residual deficits: Secondary | ICD-10-CM | POA: Insufficient documentation

## 2013-06-04 DIAGNOSIS — I10 Essential (primary) hypertension: Secondary | ICD-10-CM | POA: Insufficient documentation

## 2013-06-04 DIAGNOSIS — I714 Abdominal aortic aneurysm, without rupture, unspecified: Secondary | ICD-10-CM | POA: Insufficient documentation

## 2013-06-04 DIAGNOSIS — Z951 Presence of aortocoronary bypass graft: Secondary | ICD-10-CM | POA: Insufficient documentation

## 2013-06-04 DIAGNOSIS — I2510 Atherosclerotic heart disease of native coronary artery without angina pectoris: Secondary | ICD-10-CM | POA: Insufficient documentation

## 2013-06-04 DIAGNOSIS — E785 Hyperlipidemia, unspecified: Secondary | ICD-10-CM | POA: Insufficient documentation

## 2013-06-04 LAB — CULTURE, BLOOD (ROUTINE X 2)
CULTURE: NO GROWTH
Culture: NO GROWTH

## 2013-06-04 NOTE — Progress Notes (Signed)
Abdominal Aorta duplex complete.

## 2013-06-04 NOTE — Telephone Encounter (Signed)
lmptcb for lab results 

## 2013-06-05 NOTE — Telephone Encounter (Signed)
pt's wife notified about blood culture results from the hospital were negative. Wife said thank you  very much with vebral understanding to the results.

## 2013-07-11 ENCOUNTER — Telehealth: Payer: Self-pay | Admitting: Cardiology

## 2013-07-11 NOTE — Telephone Encounter (Signed)
Lasix 40 mg daily; bmet one week Olga MillersBrian Andrena Margerum

## 2013-07-11 NOTE — Telephone Encounter (Signed)
Patient's wife states patient is having SOB with strenuous activity and bending over to do socks/shoes. Thinks its possible fluid retention. Weight is up 5# over the past week. BP is doing well. No obvious swelling to extremities. Denies dizziness. Denies cough. Next appointment is 7/9.   Patient would like to know if he should increase his Lasix at all or be seen sooner than 7/9? Provided education on low added salt/low sodium diet. Patient's wife states he generally does well with his diet and BP readings are good. Routed to Dr. Jens Somrenshaw.

## 2013-07-11 NOTE — Telephone Encounter (Signed)
New message         Pt wife is calling about husband / he is sob when bending down to tie his shoes or put socks on / pt has gained 5 pounds in the past week or so / should he up his dosage of medications? / Pt wife states he does not have sob right now it is only when he is doing strenuous activity.

## 2013-07-14 ENCOUNTER — Other Ambulatory Visit (INDEPENDENT_AMBULATORY_CARE_PROVIDER_SITE_OTHER): Payer: Medicare Other

## 2013-07-14 ENCOUNTER — Encounter: Payer: Self-pay | Admitting: Physician Assistant

## 2013-07-14 ENCOUNTER — Ambulatory Visit (INDEPENDENT_AMBULATORY_CARE_PROVIDER_SITE_OTHER): Payer: Medicare Other | Admitting: Physician Assistant

## 2013-07-14 VITALS — BP 132/69 | HR 60 | Ht 72.0 in | Wt 178.0 lb

## 2013-07-14 DIAGNOSIS — I10 Essential (primary) hypertension: Secondary | ICD-10-CM

## 2013-07-14 DIAGNOSIS — I2510 Atherosclerotic heart disease of native coronary artery without angina pectoris: Secondary | ICD-10-CM

## 2013-07-14 DIAGNOSIS — I4891 Unspecified atrial fibrillation: Secondary | ICD-10-CM

## 2013-07-14 DIAGNOSIS — I5033 Acute on chronic diastolic (congestive) heart failure: Secondary | ICD-10-CM

## 2013-07-14 DIAGNOSIS — I2699 Other pulmonary embolism without acute cor pulmonale: Secondary | ICD-10-CM

## 2013-07-14 DIAGNOSIS — I714 Abdominal aortic aneurysm, without rupture, unspecified: Secondary | ICD-10-CM

## 2013-07-14 MED ORDER — POTASSIUM CHLORIDE CRYS ER 20 MEQ PO TBCR: 20.0000 meq | EXTENDED_RELEASE_TABLET | ORAL | Status: AC

## 2013-07-14 NOTE — Telephone Encounter (Signed)
Wife states it was 80 mg lasix ( not 40mg ) daily since last Friday including today. Pt took 2 (40mg  tablets) she states he has done this before  Scheduled appointment with Tereso NewcomerScott Weaver PA for  3pm today  Wife is aware Mylo Redebbie Talon Witting RN

## 2013-07-14 NOTE — Telephone Encounter (Signed)
Wife calls this morning b/c pt weight has not decreased since increasing lasix to 40mg  daily last Friday. States he is DOE but not at rest. No wheezing or congestion noted No angina or chest discomfort Legs are swollen during the day "but they go down at night"  Weights Friday:     176 Sat:         176 Sun:        175 Mon:        176   Wife states pt has urinated 19 times since increasing the lasix

## 2013-07-14 NOTE — Telephone Encounter (Signed)
Will forward to Dr. Jens Somrenshaw.  Mylo Redebbie Ardena Gangl RN

## 2013-07-14 NOTE — Telephone Encounter (Signed)
See my office note from today. Tereso NewcomerScott Misty Rago, PA-C   07/14/2013 8:40 PM

## 2013-07-14 NOTE — Progress Notes (Signed)
Cardiology Office Note    Date:  07/14/2013   ID:  Philip Mariner Sr., DOB [birth year], MRN [id number]  PCP:  Robb Matar, MD  Cardiologist:  Dr. Olga Millers      History of Present Illness: Philip Navarrete Sr. is a 80 y.o. male with a hx of CAD, status post CABG, small AAA, atrial fibrillation, diastolic CHF.  He was admitted to [facility] earlier this year with diarrhea and was diagnosed with pulmonary embolus and underwent IVC filter placement. Hospitalization was c/b pneumonia. Last seen here after a recent admission to the [facility] with symptomatic hypotension.  Medications were adjusted and he was placed on Lasix prn weight gain.  He called in recently with increased dyspnea and weight gain. Lasix 40 mg daily was recommended. He continued to note weight gain. It was recommended that the patient increase his Lasix further and followup today.  He notes increased dyspnea with exertion. He probably describes NYHA class IIb-3 symptoms. He denies chest discomfort. He denies orthopnea or PND. He does note increased LE edema as well as abdominal girth. His weight has steadily increased over the last month at home. He is probably up a total of 15 pounds. Of note, prior to his admission 2 months ago with hypotension, he had lost a significant amount of weight. He denies cough, wheezing. He denies syncope. He does admit to increased dietary sodium    Studies:  - LHC 3/02 following abnormal Myoview (EF 53%, moderate to severe inferior and lateral ischemia): Total occlusion of native vessels, LIMA-LAD patent, distal LAD filled well, distal LAD filled marginal branch and PDA, SVG-PDA and PL occluded, SVG-circumflex occluded, EF 50%. Medical therapy has been recommended  - Echo (05/30/13):  EF 55-60%, inferior HK to akinesis, mildly dilated aortic root (41 mm), MAC, trivial MR, mild to moderate LAE, mildly reduced RVSF, mild to moderate RAE  - Holter monitor 05/2010: Atrial  fibrillation with episodes of bradycardia in the early morning hours  - Abdominal US (05/2013): Stable juxtarenal AAA 3.3 x 3.3 cm-followup 1 year   Recent Labs: 05/29/2013: Alkaline Phosphatase 59; ALT 32; AST 23; BUN 51*; Creatinine 1.24; HCT 41.3; Hemoglobin 13.7; Platelets 471*; Potassium 4.3; Sodium 134*; TSH 2.270; WBC 12.0*   Wt Readings from Last 3 Encounters:  07/14/13 178 lb (80.74 kg)  06/03/13 169 lb (76.658 kg)  05/30/13 160 lb 0.9 oz (72.6 kg)     Past Medical History  Diagnosis Date  . Hypertension   . Coronary artery disease     a. s/p redo CABG 1988; cath 3/02: LAD, CFX and RCA occluded; S-PDA + PLBr of RCA + dCFX occluded, S-CFX occluded, L-LAD ok - treated medically  . Hyperlipidemia   . TIA (transient ischemic attack)     hx of  . Atrial fibrillation     no coumadin 2/2 falls  . Mobitz type 1 second degree AV block   . Diastolic congestive heart failure     echo 2/10: Ef 50-55%, in AK, sept HK, mild Asc aorta dilataion, mild-mod BAE, mild LVH;   b. echo 4/12:  EF 60-65%, mild LVH, mod BAE, mild MR, PASP 45  . 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 aspirin 81 MG tablet Take 162 mg by mouth daily.       Marland Kitchen  azelastine (ASTELIN) 137 MCG/SPRAY nasal spray Place 1 spray into the nose 2 (two) times daily. Use in each nostril as directed       . Calcium Carbonate-Vitamin D (CALCIUM + D) 600-200 MG-UNIT TABS Take 1 tablet by mouth.        . cetirizine (ZYRTEC) 10 MG tablet Take 10 mg by mouth daily.        . clopidogrel (PLAVIX) 75 MG tablet Take 75 mg by mouth daily with breakfast.      . ezetimibe (ZETIA) 10 MG tablet Take 1 tablet (10 mg total) by mouth daily.  30 tablet  62  . Ferrous Sulfate (IRON) 325 (65 FE) MG TABS Take 1 tablet by mouth 2 (two) times daily.       Marland Kitchen. FLUoxetine (PROZAC) 20 MG  tablet Take 20 mg by mouth daily.      . Fluticasone-Salmeterol (ADVAIR) 250-50 MCG/DOSE AEPB Inhale 1 puff into the lungs 2 (two) times daily.      . furosemide (LASIX) 40 MG tablet Take 80 mg by mouth daily as needed (if weight increases 3 lbs in one day (5 lbs in one week), swelling, shortness of breath).      . lansoprazole (PREVACID) 30 MG capsule Take 30 mg by mouth daily.       . Multiple Vitamins-Minerals (MULTI COMPLETE PO) Take 1 tablet by mouth daily.       Marland Kitchen. PROAIR HFA 108 (90 BASE) MCG/ACT inhaler Inhale 1 puff into the lungs every 4 (four) hours as needed for wheezing. PRN      . simvastatin (ZOCOR) 80 MG tablet Take 40 mg by mouth at bedtime.       . traMADol (ULTRAM) 50 MG tablet Take 50 mg by mouth every 6 (six) hours as needed.       . vitamin B-12 (CYANOCOBALAMIN) 500 MCG tablet Take 500 mcg by mouth daily.         No current facility-administered medications for this visit.    Allergies:   Review of patient's allergies indicates no known allergies.   Social History:  The patient  reports that he has never smoked. His smokeless tobacco use includes Chew. He reports that he drinks about .6 ounces of alcohol per week. He reports that he does not use illicit drugs.   Family History:  The patient's family history is not on file.   ROS:  Please see the history of present illness.   All other systems reviewed and negative.   PHYSICAL EXAM: VS:  BP 132/69  Pulse 60  Ht 6' (1.829 m)  Wt 178 lb (80.74 kg)  BMI 24.14 kg/m2   Well nourished, well developed, in no acute distress HEENT: normal Neck: minimally elevated JVD Cardiac:  normal S1, S2; irregularly irregular; 1-2/6 systolic murmur at the RUSB Lungs:  Decreased breath sounds bilaterally, no wheezing, rhonchi or rales Abd: distended,nontender, no hepatomegaly Ext: 1-2+ bilateral LE edema Skin: warm and dry Neuro:  CNs 2-12 intact, no focal abnormalities noted  EKG:  Atrial fibrillation, HR 60, normal axis, no change  from prior tracing     ASSESSMENT AND PLAN:  Acute on chronic diastolic heart failure: He is volume overloaded. He had lost a significant amount of weight at that time he became ill with his pneumonia and pulmonary embolism. I suspect that his appetite has increased and he has admitted to increased salt in his diet. I have asked him to limit his salt intake. I will increase his  Lasix to 80 mg in the morning and 40 mg in the evening for 3 days. He will then decrease to Lasix 40 mg twice a day. I will give him potassium to take 20 mEq twice a day for 3 days, then 20 mEq daily. Check  a basic metabolic panel and BNP today. He'll have a repeat basic metabolic panel in one week. He will continue to monitor his weights. If his weight does not decrease over the next week, he should call for further instructions. CAD (coronary artery disease): No angina. Continue aspirin, Plavix, statin. ATRIAL FIBRILLATION - not on anticoagulation secondary to falls:  Rate is controlled. Pulmonary embolus - status post IVC filter; not on Coumadin Essential hypertension, benign: Controlled. ABDOMINAL AORTIC ANEURYSM: Followup US in 1 year. Disposition: Followup with Dr. Jens Somrenshaw or me in one week.  Signed, Tereso NewcomerScott Weaver, PA-C  07/14/2013 3:51 PM

## 2013-07-14 NOTE — Telephone Encounter (Signed)
Change lasix to 80 mg daily for 3 days and then resume 40 mg daily; schedule ov with PA; bmet as previous. Olga MillersBrian Keilany Burnette

## 2013-07-14 NOTE — Patient Instructions (Signed)
INCREASE LASIX TO 80 MG IN THE MORNING AND 40 MG IN THE PM; DO THIS FOR 3 DAYS ; AFTER THE 3 DAYS YOU WILL CHANGE TO LASIX 40 MG TWICE DAILY  INCREASE POTASSIUM TO 20 MEQ TWICE DAILY; DO THIS FOR 3 DAYS; AFTER THE 3 DAYS THEN CHANGE TO POTASSIUM 20 MEQ DAILY   LAB WORK TODAY; BMET TO BE DONE AT THE [facility] ON [address] FROM [facility]  YOU WILL NEED A REPEAT BMET IN 1 WEEK  PLEASE FOLLOW UP WITH SCOTT WEAVER, PAC 1 WEEK SAME DAY DR. Jens SomRENSHAW IS IN THE OFFICE.

## 2013-07-14 NOTE — Telephone Encounter (Signed)
New Message:  Pt's wife states the pt has gained about 10 lbs over the past 2 weeks. Pt is c/o SOB w/ any activity.. Putting on shoes or socks.. Pt's wifes is requesting to speak with a nurse

## 2013-07-15 LAB — BASIC METABOLIC PANEL
BUN: 16 mg/dL (ref 6–23)
CALCIUM: 10.1 mg/dL (ref 8.4–10.5)
CO2: 32 meq/L (ref 19–32)
CREATININE: 0.9 mg/dL (ref 0.4–1.5)
Chloride: 97 mEq/L (ref 96–112)
GFR: 81.57 mL/min (ref >60.00)
Glucose, Bld: 78 mg/dL (ref 70–99)
Potassium: 4.6 mEq/L (ref 3.5–5.1)
Sodium: 135 mEq/L (ref 135–145)

## 2013-07-16 ENCOUNTER — Ambulatory Visit (INDEPENDENT_AMBULATORY_CARE_PROVIDER_SITE_OTHER): Payer: Medicare Other

## 2013-07-16 VITALS — BP 124/81 | HR 72 | Resp 18

## 2013-07-16 DIAGNOSIS — B351 Tinea unguium: Secondary | ICD-10-CM

## 2013-07-16 DIAGNOSIS — M79609 Pain in unspecified limb: Secondary | ICD-10-CM

## 2013-07-16 NOTE — Patient Instructions (Signed)

## 2013-07-16 NOTE — Progress Notes (Signed)
   Subjective:    Patient ID: Philip Marinerharles Erby Sr., male    DOB: 03/05/1923, 80 y.o.   MRN: [id number]  HPI I NEED TO HAVE MY TOENAILS TRIMMED UP    Review of Systems no new systemic changes or findings noted     Objective:   Physical Exam Neurovascular status is intact pedal pulses are palpable DP postal for PT plus one over 4 bilateral capillary refill time 3 seconds temperature warm turgor normal no edema noted neurologically epicritic and proprioceptive sensations intact and symmetric bilateral. Normal plantar response and DTRs neurologically skin color pigment normal hair growth absent nails thick criptotic incurvated friable and somewhat discolored brittle particular hallux nails bilateral no open wounds ulcerations no secondary infections. No other new changes or difficulties at this time       Assessment & Plan:  Assessment onychomycosis painful thick dystrophic mycotic nails 1 through 5 bilateral debridement at this time return for future mycotic nail care as needed  Alvan Dameichard Sikora DPM

## 2013-07-30 ENCOUNTER — Ambulatory Visit (INDEPENDENT_AMBULATORY_CARE_PROVIDER_SITE_OTHER): Payer: Medicare Other | Admitting: Physician Assistant

## 2013-07-30 ENCOUNTER — Encounter: Payer: Self-pay | Admitting: Physician Assistant

## 2013-07-30 ENCOUNTER — Telehealth: Payer: Self-pay | Admitting: *Deleted

## 2013-07-30 VITALS — BP 150/70 | HR 50 | Ht 72.0 in | Wt 175.0 lb

## 2013-07-30 DIAGNOSIS — I482 Chronic atrial fibrillation, unspecified: Secondary | ICD-10-CM

## 2013-07-30 DIAGNOSIS — I10 Essential (primary) hypertension: Secondary | ICD-10-CM

## 2013-07-30 DIAGNOSIS — I5032 Chronic diastolic (congestive) heart failure: Secondary | ICD-10-CM

## 2013-07-30 DIAGNOSIS — I4891 Unspecified atrial fibrillation: Secondary | ICD-10-CM

## 2013-07-30 DIAGNOSIS — I2510 Atherosclerotic heart disease of native coronary artery without angina pectoris: Secondary | ICD-10-CM

## 2013-07-30 LAB — BASIC METABOLIC PANEL
BUN: 18 mg/dL (ref 6–23)
CALCIUM: 9.7 mg/dL (ref 8.4–10.5)
CO2: 30 mEq/L (ref 19–32)
Chloride: 100 mEq/L (ref 96–112)
Creatinine, Ser: 0.8 mg/dL (ref 0.4–1.5)
GFR: 91.73 mL/min (ref >60.00)
Glucose, Bld: 104 mg/dL — ABNORMAL HIGH (ref 70–99)
POTASSIUM: 4.3 meq/L (ref 3.5–5.1)
SODIUM: 135 meq/L (ref 135–145)

## 2013-07-30 NOTE — Patient Instructions (Signed)
CONTINUE CURRENT DOSE OF LASIX 40 MG DAILY; MONITOR YOUR WEIGHT DAILY AND IF WEIGHT IS 173 LB'S OR ABOVE YOU ARE TO TAKE AN EXTRA LASIX 40 MG UNTIL YOUR WEIGHT IS BELOW 173 LB.  LAB WORK TODAY; BMET  Your physician wants you to follow-up in: 3 MONTHS WITH DR. CRENSHAW. You will receive a reminder letter in the mail two months in advance. If you don't receive a letter, please call our office to schedule the follow-up appointment.

## 2013-07-30 NOTE — Progress Notes (Signed)
Cardiology Office Note    Date:  07/30/2013   ID:  Philip Marinerharles Ries Sr., DOB 03/29/1923, MRN [id number]  PCP:  Robb MatarHOMAS, MILLARD, MD  Cardiologist:  Dr. Olga MillersBrian Crenshaw      History of Present Illness: Philip MarinerCharles Storti Sr. is a 80 y.o. male with a hx of CAD, status post CABG, small AAA, atrial fibrillation, diastolic CHF.  He was admitted to [facility] earlier this year with diarrhea and was diagnosed with pulmonary embolus and underwent IVC filter placement. Hospitalization was c/b pneumonia. Last seen here after a recent admission to the [facility] with symptomatic hypotension.  Medications were adjusted and he was placed on Lasix prn weight gain.  I saw him 2 weeks ago with volume excess. I adjusted his Lasix. He returns for follow up. His weight at home decreased to a nadir of 166. Weight today was back up to 170. He feels much better. Breathing is improved. He denies chest pain, orthopnea, PND or syncope. LE edema is much improved. He does have significant problems with balance. He fell again this morning.   Studies:  - LHC 3/02 following abnormal Myoview (EF 53%, moderate to severe inferior and lateral ischemia): Total occlusion of native vessels, LIMA-LAD patent, distal LAD filled well, distal LAD filled marginal branch and PDA, SVG-PDA and PL occluded, SVG-circumflex occluded, EF 50%. Medical therapy has been recommended  - Echo (05/30/13):  EF 55-60%, inferior HK to akinesis, mildly dilated aortic root (41 mm), MAC, trivial MR, mild to moderate LAE, mildly reduced RVSF, mild to moderate RAE  - Holter monitor 05/2010: Atrial fibrillation with episodes of bradycardia in the early morning hours  - Abdominal US (05/2013): Stable juxtarenal AAA 3.3 x 3.3 cm-followup 1 year   Recent Labs: 05/29/2013: Alkaline Phosphatase 59; ALT 32; AST 23; HCT 41.3; Hemoglobin 13.7; Platelets 471*; TSH 2.270; WBC 12.0*  07/14/2013: BUN 16; Creatinine 0.9; Potassium 4.6; Sodium 135   Wt Readings  from Last 3 Encounters:  07/30/13 175 lb (79.379 kg)  07/14/13 178 lb (80.74 kg)  06/03/13 169 lb (76.658 kg)     Past Medical History  Diagnosis Date  . Hypertension   . Coronary artery disease     a. s/p redo CABG 1988; cath 3/02: LAD, CFX and RCA occluded; S-PDA + PLBr of RCA + dCFX occluded, S-CFX occluded, L-LAD ok - treated medically  . Hyperlipidemia   . TIA (transient ischemic attack)     hx of  . Atrial fibrillation     no coumadin 2/2 falls  . Mobitz type 1 second degree AV block   . Diastolic congestive heart failure     echo 2/10: Ef 50-55%, in AK, sept HK, mild Asc aorta dilataion, mild-mod BAE, mild LVH;   b. echo 4/12:  EF 60-65%, mild LVH, mod BAE, mild MR, PASP 45  . 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 aspirin 81 MG tablet Take 162 mg by mouth daily.       Marland Kitchen. azelastine (ASTELIN) 137 MCG/SPRAY nasal spray Place 1 spray into the nose 2 (two) times daily. Use in each nostril as directed       . Calcium Carbonate-Vitamin D (CALCIUM + D) 600-200 MG-UNIT TABS Take 1 tablet by mouth.        . cetirizine (  ZYRTEC) 10 MG tablet Take 10 mg by mouth daily.        . clopidogrel (PLAVIX) 75 MG tablet Take 75 mg by mouth daily with breakfast.      . ezetimibe (ZETIA) 10 MG tablet Take 1 tablet (10 mg total) by mouth daily.  30 tablet  62  . Ferrous Sulfate (IRON) 325 (65 FE) MG TABS Take 1 tablet by mouth 2 (two) times daily.       Marland Kitchen FLUoxetine (PROZAC) 20 MG tablet Take 20 mg by mouth daily.      . Fluticasone-Salmeterol (ADVAIR) 250-50 MCG/DOSE AEPB Inhale 1 puff into the lungs 2 (two) times daily.      . furosemide (LASIX) 40 MG tablet Take 80 mg by mouth daily as needed (if weight increases 3 lbs in one day (5 lbs in one week), swelling, shortness of breath).      . lansoprazole (PREVACID) 30 MG capsule  Take 30 mg by mouth daily.       Marland Kitchen lisinopril-hydrochlorothiazide (PRINZIDE,ZESTORETIC) 20-25 MG per tablet       . Multiple Vitamins-Minerals (MULTI COMPLETE PO) Take 1 tablet by mouth daily.       . potassium chloride SA (K-DUR,KLOR-CON) 20 MEQ tablet Take 1 tablet (20 mEq total) by mouth as directed. Take 20 meq twice daily for 3 days; after 3 days you will take 1 tablet daily  45 tablet  11  . PROAIR HFA 108 (90 BASE) MCG/ACT inhaler Inhale 1 puff into the lungs every 4 (four) hours as needed for wheezing. PRN      . simvastatin (ZOCOR) 80 MG tablet Take 40 mg by mouth at bedtime.       . traMADol (ULTRAM) 50 MG tablet Take 50 mg by mouth every 6 (six) hours as needed.       . vitamin B-12 (CYANOCOBALAMIN) 500 MCG tablet Take 500 mcg by mouth daily.         No current facility-administered medications for this visit.    Allergies:   Review of patient's allergies indicates no known allergies.   Social History:  The patient  reports that he has never smoked. His smokeless tobacco use includes Chew. He reports that he drinks about .6 ounces of alcohol per week. He reports that he does not use illicit drugs.   Family History:  The patient's family history is not on file.   ROS:  Please see the history of present illness.     All other systems reviewed and negative.   PHYSICAL EXAM: VS:  BP 150/70  Pulse 50  Ht 6' (1.829 m)  Wt 175 lb (79.379 kg)  BMI 23.73 kg/m2   Well nourished, well developed, in no acute distress HEENT: normal Neck: no JVD Cardiac:  normal S1, S2; irregularly irregular; 1-2/6 systolic murmur at the RUSB Lungs:  Decreased breath sounds bilaterally, no wheezing, rhonchi or rales Abd: soft,nontender, no hepatomegaly Ext: no LE edema Skin: warm and dry Neuro:  CNs 2-12 intact, no focal abnormalities noted  EKG:  Atrial fibrillation, HR 50     ASSESSMENT AND PLAN:  Chronic diastolic heart failure:  Volume improved.  Continue current therapy. I have recommended  that he take an extra dose of Lasix if his weight increases to 173 pounds or more at home.  Check BMET today.  CAD (coronary artery disease): No angina. Continue aspirin, Plavix, statin. ATRIAL FIBRILLATION - not on anticoagulation secondary to falls:  Rate is controlled. Pulmonary embolus - status  post IVC filter; not on Coumadin Essential hypertension, benign:  Fair control. ABDOMINAL AORTIC ANEURYSM: Followup US in 1 year. Disposition: Followup with Dr. Jens Somrenshaw in 3 months.  Signed, Tereso NewcomerScott Tayvon Culley, PA-C  07/30/2013 3:25 PM

## 2013-07-30 NOTE — Telephone Encounter (Signed)
busy

## 2013-07-31 NOTE — Telephone Encounter (Signed)
s/w pt's wife about lab results with verbal understanding

## 2013-08-07 ENCOUNTER — Ambulatory Visit: Payer: Medicare Other | Admitting: Cardiology

## 2013-09-01 ENCOUNTER — Other Ambulatory Visit: Payer: Self-pay

## 2013-09-01 MED ORDER — CLOPIDOGREL BISULFATE 75 MG PO TABS: 75.0000 mg | ORAL_TABLET | Freq: Every day | ORAL | Status: DC

## 2013-10-15 ENCOUNTER — Ambulatory Visit (INDEPENDENT_AMBULATORY_CARE_PROVIDER_SITE_OTHER): Payer: Medicare Other

## 2013-10-15 VITALS — BP 145/79 | HR 57 | Resp 18

## 2013-10-15 DIAGNOSIS — M79676 Pain in unspecified toe(s): Secondary | ICD-10-CM

## 2013-10-15 DIAGNOSIS — M79609 Pain in unspecified limb: Secondary | ICD-10-CM

## 2013-10-15 DIAGNOSIS — B351 Tinea unguium: Secondary | ICD-10-CM

## 2013-10-15 NOTE — Progress Notes (Signed)
   Subjective:    Patient ID: Philip Mariner Sr., male    DOB: 05-10-1923, 80 y.o.   MRN: [id number]  HPI I AM HERE TO HAVE MY FEET CHECKED AND TO HAVE MY TOENAILS TRIMMED    Review of Systems no new findings or systemic changes noted     Objective:   Physical Exam Neurovascular status is intact pedal pulses palpable DP +2 PT plus one over 4 bilateral capillary refill time 3 seconds all digits no edema noted epicritic and proprioceptive sensations intact bilateral normal plantar response and DTRs skin color pigment and hair growth normal diminished hair growth absent nails thick brittle incurvated friable discolored consistent with onychomycosis and brittleness of nails hallux nails bilateral no open wounds however does have apparently a trauma to the left great toe he tried to cut out a piece of is now itself there is some bleeding with distal and lateral nail fold no active bleeding noted will dried blood was identified. No other open wounds no secondary infections       Assessment & Plan:  Assessment this time is onychomycosis painful thick mycotic nails and brittleness discoloration and friability nails debrided 1 through 5 bilateral at this time return for future palliative care is needed  Alvan Dame DPM

## 2013-10-15 NOTE — Patient Instructions (Signed)

## 2013-11-11 ENCOUNTER — Ambulatory Visit (INDEPENDENT_AMBULATORY_CARE_PROVIDER_SITE_OTHER): Payer: Medicare Other | Admitting: Cardiology

## 2013-11-11 ENCOUNTER — Encounter: Payer: Self-pay | Admitting: Cardiology

## 2013-11-11 VITALS — BP 136/80 | HR 54 | Ht 72.0 in | Wt 176.0 lb

## 2013-11-11 DIAGNOSIS — I4891 Unspecified atrial fibrillation: Secondary | ICD-10-CM

## 2013-11-11 NOTE — Patient Instructions (Signed)
No changes have been made today in your current medications or treatment plan.  Brittainy Simmons, PA-C, recommends that you schedule a follow-up appointment in 6 months with Dr Jens Somrenshaw.

## 2013-11-13 NOTE — Progress Notes (Signed)
11/13/2013 Philip Mariner Sr.   28-May-1923  [id number]  Primary Physician: Robb Matar, MD Primary Cardiologist: Dr. Jens Som  HPI:  Philip Mariner Sr. is a 80 y.o. Male, followed by Dr. Jens Som, who presents to clinic for routien evaluation. His cardiovascular history is significant for CAD status post coronary artery bypassing grafting, diastolic CHF, small abdominal aortic aneurysm, and atrial fibrillation.  His most recent catheterization was in March of 2002. This followed an abnormal Myoview which showed an ejection fraction of 53% and moderate-to-severe ischemia in the inferior and lateral walls from the apex to the base. His catheterization at that time showed total occlusion of his native vessels. The left internal mammary artery to the left anterior descending was patent, and the distal left anterior descending filled well. The distal left anterior descending also filled a marginal branch and a posterior descending artery. His ejection fraction was 50%. Note, the saphenous vein graft to the posterior descending artery and posterolateral was completely occluded, and the saphenous vein graft to the circumflex was completely occluded. He has been treated medically.   Regarding his diastolic dysfunction, he has had problems with volume excess. His last echocardiogram in May of 2015 showed normal LV function with EF at 55-60%, moderate biatrial enlargement, mild right ventricular enlargement and mild mitral regurgitation. Due to symptomatic hypotension, his medications including Lasix have been adjusted. He now only takes Lasix prn based on daily weights.   His AAA is followed yearly. Last assessment was 05/2013 and was stable at 3.3 x 3.3 cm.   He has been fairly asymptomatic with his atrial fibrillation and ventricular response is slow in the 50s. He is not on anticoagulation secondary to frequent falls.    Today in clinic, he is accompanied by his granddaughter. He states that he has  been doing well. He suffered a mechanical fall 2 weeks ago w/o injury. He has been undergoing home PT through [facility] and feels that he is benifiting from it and getting stronger. He denies any anginal symptoms. He also denies DOE, orthopnea, PND and LEE. He brings with him to clinic a record of his daily weights, which have remained stable near his baseline 168-171. He usually takes Lasix at least once a week. He denies any symptoms of hypotension. He also checks his BP and HR at home and vitals have remained stable.  Denies palpitation, dizziness, syncope/ near syncope. Compliant with medications. Adherent to low sodium diet.    Current Outpatient Prescriptions  Medication Sig Dispense Refill  . aspirin 81 MG tablet Take 162 mg by mouth daily.       Marland Kitchen azelastine (ASTELIN) 137 MCG/SPRAY nasal spray Place 1 spray into the nose 2 (two) times daily. Use in each nostril as directed       . Calcium Carbonate-Vitamin D (CALCIUM + D) 600-200 MG-UNIT TABS Take 1 tablet by mouth.        . cetirizine (ZYRTEC) 10 MG tablet Take 10 mg by mouth daily.        . clopidogrel (PLAVIX) 75 MG tablet Take 1 tablet (75 mg total) by mouth daily with breakfast.  30 tablet  3  . FETZIMA TITRATION 20 & 40 MG C4PK       . FLUoxetine (PROZAC) 20 MG tablet Take 20 mg by mouth daily.      . Fluticasone-Salmeterol (ADVAIR) 250-50 MCG/DOSE AEPB Inhale 1 puff into the lungs 2 (two) times daily.      . furosemide (LASIX) 40 MG  tablet Take 80 mg by mouth daily as needed (if weight increases 3 lbs in one day (5 lbs in one week), swelling, shortness of breath).      . lansoprazole (PREVACID) 30 MG capsule Take 30 mg by mouth daily.       . Multiple Vitamins-Minerals (MULTI COMPLETE PO) Take 1 tablet by mouth daily.       . potassium chloride SA (K-DUR,KLOR-CON) 20 MEQ tablet Take 1 tablet (20 mEq total) by mouth as directed. Take 20 meq twice daily for 3 days; after 3 days you will take 1 tablet daily  45 tablet  11  .  PROAIR HFA 108 (90 BASE) MCG/ACT inhaler Inhale 1 puff into the lungs every 4 (four) hours as needed for wheezing. PRN      . simvastatin (ZOCOR) 80 MG tablet Take 40 mg by mouth at bedtime.       . traMADol (ULTRAM) 50 MG tablet Take 50 mg by mouth every 6 (six) hours as needed.       . vitamin B-12 (CYANOCOBALAMIN) 500 MCG tablet Take 500 mcg by mouth daily.         No current facility-administered medications for this visit.    No Known Allergies  History   Social History  . Marital Status: Married    Spouse Name: N/A    Number of Children: N/A  . Years of Education: N/A   Occupational History  . Not on file.   Social History Main Topics  . Smoking status: Never Smoker   . Smokeless tobacco: Current User    Types: Chew  . Alcohol Use: 0.6 oz/week    1 Glasses of wine per week  . Drug Use: No  . Sexual Activity: Not on file   Other Topics Concern  . Not on file   Social History Narrative  . No narrative on file     Review of Systems: General: negative for chills, fever, night sweats or weight changes.  Cardiovascular: negative for chest pain, dyspnea on exertion, edema, orthopnea, palpitations, paroxysmal nocturnal dyspnea or shortness of breath Dermatological: negative for rash Respiratory: negative for cough or wheezing Urologic: negative for hematuria Abdominal: negative for nausea, vomiting, diarrhea, bright red blood per rectum, melena, or hematemesis Neurologic: negative for visual changes, syncope, or dizziness All other systems reviewed and are otherwise negative except as noted above.    Blood pressure 136/80, pulse 54, height 6' (1.829 m), weight 176 lb (79.833 kg).  General appearance: alert, cooperative and no distress Neck: no carotid bruit and no JVD Lungs: clear to auscultation bilaterally Heart: irregularly irregular rhythm and 2/6 SM Extremities: no LEE Pulses: 2+ and symmetric Skin: warm and dry Neurologic: Grossly normal  EKG Afib with a  slow ventricular response.   ASSESSMENT AND PLAN:   1. CAD: H/o of CABG. Stable without anginal symptoms. Continue ASA and statin. Not on a BB due bradycardia/hypotension. ACE also d/c due to hypotension.  2. Chronic Diastolic HF: Volume status well controlled. Compliant with daily weights and low sodium diet. Continue Lasix PRN.   3. Atrial Fibrillation: Slow ventricular response in the mid 50s. Asymptomatic. Not an anticoagulation candidate due to frequent falls.   4. AAA: stable at last assessment at 3.3 x 3.3 cm. Continue to follow yearly. Due for repeat US 05/2014. He is not a current smoker. BP is well controlled.   5. Hypotension: BP improved and stable with discontinuation of several antihypertensives.   PLAN  Patient appears stable  from a cardiovascular standpoint. F/U with Dr. Jens Somrenshaw in 6 months or sooner if needed.   Edna Rede, BRITTAINYPA-C 11/13/2013 7:09 PM

## 2014-01-12 ENCOUNTER — Ambulatory Visit (INDEPENDENT_AMBULATORY_CARE_PROVIDER_SITE_OTHER): Payer: Medicare Other

## 2014-01-12 VITALS — BP 138/62 | HR 72 | Resp 12

## 2014-01-12 DIAGNOSIS — B351 Tinea unguium: Secondary | ICD-10-CM

## 2014-01-12 DIAGNOSIS — M79676 Pain in unspecified toe(s): Secondary | ICD-10-CM

## 2014-01-12 NOTE — Patient Instructions (Signed)

## 2014-01-12 NOTE — Progress Notes (Signed)
   Subjective:    Patient ID: Philip Marinerharles Cosma Sr., male    DOB: 03/08/1923, 80 y.o.   MRN: [id number]  HPI patient presents for mycotic nail care    Review of Systems no new findings or systemic changes noted     Objective:   Physical Exam Neurovascular status unchanged pedal pulses are palpable DP +2 PT plus one over 4 bilateral capillary fill time 3 seconds epicritic and proprioceptive sensations intact there is normal plantar response and DTRs. Dermatologically nails thick criptotic incurvated and brittle fourth right following debridement is treated with limited cane and Neosporin does have history of trauma to the nail no open wounds no ulcers no secondary infections. Mild digital contractures hammertoe deformities with mild arthropathy noted       Assessment & Plan:  Assessment this time onychomycosis thick and brittle [place] deformed brittle discolored friable nails 1 through 5 bilateral debrided at this time lumicain applied to the fourth right following debridement return for palliative care every 2-3 months as recommended next  Alvan Dameichard Tyrhonda Georgiades DPM

## 2014-01-22 ENCOUNTER — Other Ambulatory Visit: Payer: Self-pay | Admitting: Cardiology

## 2014-01-22 MED ORDER — CLOPIDOGREL BISULFATE 75 MG PO TABS: 75.0000 mg | ORAL_TABLET | Freq: Every day | ORAL | Status: DC

## 2014-04-13 ENCOUNTER — Ambulatory Visit (INDEPENDENT_AMBULATORY_CARE_PROVIDER_SITE_OTHER): Payer: PPO

## 2014-04-13 VITALS — BP 155/79 | HR 61 | Resp 18

## 2014-04-13 DIAGNOSIS — B351 Tinea unguium: Secondary | ICD-10-CM

## 2014-04-13 DIAGNOSIS — M722 Plantar fascial fibromatosis: Secondary | ICD-10-CM

## 2014-04-13 DIAGNOSIS — M79676 Pain in unspecified toe(s): Secondary | ICD-10-CM

## 2014-04-13 NOTE — Progress Notes (Addendum)
   Subjective:    Patient ID: Philip Marinerharles Reimers Sr., male    DOB: 12/03/1923, 81 y.o.   MRN: [id number]  HPI I AM HERE TO GET MY TOENAILS TRIMMED UP    Review of Systems no new findings or systemic changes noted     Objective:   Physical Exam Neurovascular status appears to be intact and unchanged DP +2 PT plus one over 4 Refill time 3 seconds all digits epicritic and proprioceptive sensations intact although diminished to forefoot digits and plantar arch. There is normal plantar response DTRs not elicited dermatologically skin color pigment normal hair growth absent nails thick brittle crumbly dry friable discolored and brittle one through 5 bilateral no open wounds no ulcers no secondary infections noted no other new complications noted mild digital contractures are identified. Patient does have some diffuse keratoses inferior right heel which is debrided back at this time otherwise no other new issues noted     Assessment & Plan:  Assessment this time history of diabetes with history peripheral neuropathy and complication nails thick brittle crumbly dystrophic friable dystrophic friable and criptotic no open wounds no ulcers no secondary infections nails debrided 10 at this visit return for future palliative care is needed  Alvan Dameichard Elmo Rio DPM

## 2014-06-05 ENCOUNTER — Other Ambulatory Visit: Payer: Self-pay | Admitting: *Deleted

## 2014-06-05 MED ORDER — CLOPIDOGREL BISULFATE 75 MG PO TABS: 75.0000 mg | ORAL_TABLET | Freq: Every day | ORAL | Status: DC

## 2014-06-24 ENCOUNTER — Other Ambulatory Visit: Payer: Self-pay | Admitting: Cardiology

## 2014-06-24 DIAGNOSIS — I714 Abdominal aortic aneurysm, without rupture, unspecified: Secondary | ICD-10-CM

## 2014-06-25 NOTE — Progress Notes (Signed)
HPI: FU CAD, status post CABG, small AAA, atrial fibrillation, diastolic CHF; h/o pulmonary embolus s/p IVC filter.Since last seen, the patient has dyspnea with more extreme activities but not with routine activities. It is relieved with rest. It is not associated with chest pain. There is no orthopnea, PND or pedal edema. There is no syncope or palpitations. There is no exertional chest pain.    Studies: - LHC 3/02 following abnormal Myoview (EF 53%, moderate to severe inferior and lateral ischemia): Total occlusion of native vessels, LIMA-LAD patent, distal LAD filled well, distal LAD filled marginal branch and PDA, SVG-PDA and PL occluded, SVG-circumflex occluded, EF 50%. 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 aspirin 81 MG tablet Take 162 mg by mouth daily.     Marland Kitchen azelastine (ASTELIN) 137 MCG/SPRAY nasal spray Place 1 spray into the nose 2 (two) times daily. Use in each nostril as directed     . Calcium Carbonate-Vitamin D (CALCIUM + D) 600-200 MG-UNIT TABS Take 1 tablet by mouth.      . cetirizine (ZYRTEC) 10 MG tablet Take 10 mg by mouth daily.      . clopidogrel (PLAVIX) 75 MG tablet Take 1 tablet (75 mg total) by mouth daily with breakfast. 30 tablet 0  . FETZIMA TITRATION 20 & 40 MG C4PK     . FLUoxetine (PROZAC) 20 MG tablet Take 20 mg by mouth daily.    . Fluticasone-Salmeterol (ADVAIR) 250-50 MCG/DOSE AEPB Inhale 1 puff into the lungs 2 (two) times daily.    . furosemide (LASIX) 40 MG tablet Take 80 mg by mouth daily as needed (if weight increases 3 lbs in one day  (5 lbs in one week), swelling, shortness of breath).    . lansoprazole (PREVACID) 30 MG capsule Take 30 mg by mouth daily.     . Multiple Vitamins-Minerals (MULTI COMPLETE PO) Take 1 tablet by mouth daily.     . potassium chloride SA (K-DUR,KLOR-CON) 20 MEQ tablet Take 1 tablet (20 mEq total) by mouth as directed. Take 20 meq twice daily for 3 days; after 3 days you will take 1 tablet daily 45 tablet 11  . PROAIR HFA 108 (90 BASE) MCG/ACT inhaler Inhale 1 puff into the lungs every 4 (four) hours as needed for wheezing. PRN    . simvastatin (ZOCOR) 80 MG tablet Take 40 mg by mouth at bedtime.     . terbinafine (LAMISIL) 1 % cream Apply 1 application topically 2 (two) times daily.    . traMADol (ULTRAM) 50 MG tablet Take 50 mg by mouth every 6 (six) hours as needed.     . vitamin B-12 (CYANOCOBALAMIN) 500 MCG tablet Take 500 mcg by mouth daily.       No current facility-administered medications for this visit.     Past Medical History  Diagnosis Date  . Hypertension   . Coronary artery disease     a. s/p redo CABG 1988; cath 3/02: LAD, CFX and RCA occluded; S-PDA + PLBr of RCA + dCFX occluded, S-CFX occluded, L-LAD ok - treated medically  . Hyperlipidemia   .  TIA (transient ischemic attack)     hx of  . Atrial fibrillation     no coumadin 2/2 falls  . Mobitz type 1 second degree AV block   . Diastolic congestive heart failure     echo 2/10: Ef 50-55%, in AK, sept HK, mild Asc aorta dilataion, mild-mod BAE, mild LVH;   b. echo 4/12:  EF 60-65%, mild LVH, mod BAE, mild MR, PASP 45  . Abdominal aortic aneurysm     u/s 10/11: 3.5 x 3.5 cm;  Abdominal US 01/2012: Stable dimensions of juxtarenal AAA 3 x 3.3 cm-follow up 1 year    Past Surgical History  Procedure Laterality Date  . Coronary artery bypass graft  1977    with redo 1988  . Prostatectomy      History   Social History  . Marital Status: Married    Spouse Name: N/A  . Number of Children: N/A  . Years of Education: N/A    Occupational History  . Not on file.   Social History Main Topics  . Smoking status: Never Smoker   . Smokeless tobacco: Current User    Types: Chew  . Alcohol Use: 0.6 oz/week    1 Glasses of wine per week  . Drug Use: No  . Sexual Activity: Not on file   Other Topics Concern  . Not on file   Social History Narrative    ROS: no fevers or chills, productive cough, hemoptysis, dysphasia, odynophagia, melena, hematochezia, dysuria, hematuria, rash, seizure activity, orthopnea, PND, pedal edema, claudication. Remaining systems are negative.  Physical Exam: Well-developed frail in no acute distress.  Skin is warm and dry.  HEENT is normal.  Neck is supple.  Chest is clear to auscultation with normal expansion.  Cardiovascular exam is irregular Abdominal exam nontender or distended. No masses palpated. Extremities show no edema. neuro grossly intact  ECG atrial fibrillation with PVCs or aberrantly conducted beats. Nonspecific ST changes.

## 2014-06-30 ENCOUNTER — Encounter: Payer: Self-pay | Admitting: Cardiology

## 2014-06-30 ENCOUNTER — Ambulatory Visit (INDEPENDENT_AMBULATORY_CARE_PROVIDER_SITE_OTHER): Payer: PPO | Admitting: Cardiology

## 2014-06-30 VITALS — BP 174/82 | HR 64 | Ht 72.0 in | Wt 178.0 lb

## 2014-06-30 DIAGNOSIS — I10 Essential (primary) hypertension: Secondary | ICD-10-CM | POA: Diagnosis not present

## 2014-06-30 DIAGNOSIS — I5032 Chronic diastolic (congestive) heart failure: Secondary | ICD-10-CM

## 2014-06-30 DIAGNOSIS — E7800 Pure hypercholesterolemia, unspecified: Secondary | ICD-10-CM

## 2014-06-30 DIAGNOSIS — I714 Abdominal aortic aneurysm, without rupture, unspecified: Secondary | ICD-10-CM

## 2014-06-30 DIAGNOSIS — I2510 Atherosclerotic heart disease of native coronary artery without angina pectoris: Secondary | ICD-10-CM | POA: Diagnosis not present

## 2014-06-30 NOTE — Addendum Note (Signed)
Addended by: Freddi StarrMATHIS, DEBRA W on: 06/30/2014 12:55 PM   Modules accepted: Orders

## 2014-06-30 NOTE — Assessment & Plan Note (Signed)
Blood pressure elevated. However he does have orthostatic symptoms at times and has fallen previously. I would not advance pressure medications.

## 2014-06-30 NOTE — Assessment & Plan Note (Signed)
Patient is euvolemic on examination. Continue present dose of Lasix. Check potassium and renal function. 

## 2014-06-30 NOTE — Assessment & Plan Note (Signed)
Continue statin. 

## 2014-06-30 NOTE — Assessment & Plan Note (Signed)
Heart rate is controlled. Continue aspirin. He is not on anticoagulation given history of recurrent falls.

## 2014-06-30 NOTE — Patient Instructions (Signed)
Your physician wants you to follow-up in: 6 MONTHS WITH DR Jens SomRENSHAW You will receive a reminder letter in the mail two months in advance. If you don't receive a letter, please call our office to schedule the follow-up appointment.   Your physician recommends that you return for lab work WITH PCP= BMP= DX CODE= I10

## 2014-06-30 NOTE — Assessment & Plan Note (Signed)
Schedule followup abdominal ultrasound. 

## 2014-06-30 NOTE — Assessment & Plan Note (Signed)
Continue aspirin and statin. 

## 2014-07-02 ENCOUNTER — Ambulatory Visit (HOSPITAL_COMMUNITY): Payer: PPO | Attending: Cardiovascular Disease

## 2014-07-02 ENCOUNTER — Other Ambulatory Visit (INDEPENDENT_AMBULATORY_CARE_PROVIDER_SITE_OTHER): Payer: PPO | Admitting: *Deleted

## 2014-07-02 DIAGNOSIS — E785 Hyperlipidemia, unspecified: Secondary | ICD-10-CM | POA: Insufficient documentation

## 2014-07-02 DIAGNOSIS — Z951 Presence of aortocoronary bypass graft: Secondary | ICD-10-CM | POA: Insufficient documentation

## 2014-07-02 DIAGNOSIS — I714 Abdominal aortic aneurysm, without rupture, unspecified: Secondary | ICD-10-CM

## 2014-07-02 DIAGNOSIS — I10 Essential (primary) hypertension: Secondary | ICD-10-CM | POA: Diagnosis not present

## 2014-07-02 DIAGNOSIS — Z8673 Personal history of transient ischemic attack (TIA), and cerebral infarction without residual deficits: Secondary | ICD-10-CM | POA: Diagnosis not present

## 2014-07-02 DIAGNOSIS — I2510 Atherosclerotic heart disease of native coronary artery without angina pectoris: Secondary | ICD-10-CM | POA: Diagnosis not present

## 2014-07-02 LAB — BASIC METABOLIC PANEL
BUN: 14 mg/dL (ref 6–23)
CHLORIDE: 94 meq/L — AB (ref 96–112)
CO2: 31 meq/L (ref 19–32)
CREATININE: 0.76 mg/dL (ref 0.40–1.50)
Calcium: 10 mg/dL (ref 8.4–10.5)
GFR: 102.74 mL/min (ref >60.00)
GLUCOSE: 102 mg/dL — AB (ref 70–99)
Potassium: 4.2 mEq/L (ref 3.5–5.1)
SODIUM: 130 meq/L — AB (ref 135–145)

## 2014-07-07 ENCOUNTER — Other Ambulatory Visit: Payer: Self-pay

## 2014-07-07 MED ORDER — CLOPIDOGREL BISULFATE 75 MG PO TABS: 75.0000 mg | ORAL_TABLET | Freq: Every day | ORAL | Status: AC

## 2014-07-07 MED ORDER — FUROSEMIDE 40 MG PO TABS: 80.0000 mg | ORAL_TABLET | Freq: Every day | ORAL | Status: DC | PRN

## 2014-07-20 ENCOUNTER — Ambulatory Visit: Payer: PPO

## 2014-07-20 ENCOUNTER — Ambulatory Visit (INDEPENDENT_AMBULATORY_CARE_PROVIDER_SITE_OTHER): Payer: PPO | Admitting: Podiatry

## 2014-07-20 ENCOUNTER — Ambulatory Visit: Payer: PPO | Admitting: Podiatry

## 2014-07-20 ENCOUNTER — Encounter: Payer: Self-pay | Admitting: Podiatry

## 2014-07-20 VITALS — BP 86/62 | HR 63 | Resp 18

## 2014-07-20 DIAGNOSIS — Q828 Other specified congenital malformations of skin: Secondary | ICD-10-CM | POA: Diagnosis not present

## 2014-07-20 DIAGNOSIS — M79676 Pain in unspecified toe(s): Secondary | ICD-10-CM | POA: Diagnosis not present

## 2014-07-20 DIAGNOSIS — B351 Tinea unguium: Secondary | ICD-10-CM | POA: Diagnosis not present

## 2014-07-20 NOTE — Progress Notes (Signed)
Patient ID: Philip Mariner Sr., male   DOB: Jan 27, 1924, 81 y.o.   MRN: [id number] Complaint:  Visit Type: Patient returns to my office for continued preventative foot care services. Complaint: Patient states" my nails have grown long and thick and become painful to walk and wear shoes" . He presents for preventative foot care services. No changes to ROS  Podiatric Exam: Vascular: dorsalis pedis and posterior tibial pulses are palpable bilateral. Capillary return is immediate. Temperature gradient is WNL. Skin turgor WNL  Sensorium: Normal Semmes Weinstein monofilament test. Normal tactile sensation bilaterally. Nail Exam: Pt has thick disfigured discolored nails with subungual debris noted bilateral entire nail hallux through fifth toenails Ulcer Exam: There is no evidence of ulcer or pre-ulcerative changes or infection. Orthopedic Exam: Muscle tone and strength are WNL. No limitations in general ROM. No crepitus or effusions noted. Foot type and digits show no abnormalities. Bony prominences are unremarkable. Pain across his right foot due to severe hammering right toes. Skin: No Porokeratosis. No infection or ulcers.  Porokeratosis right heel.    Diagnosis:  Tinea unguium, Pain in right toe, pain in left toes, Porokeratosis right heel  Treatment & Plan Procedures and Treatment: Consent by patient was obtained for treatment procedures. The patient understood the discussion of treatment and procedures well. All questions were answered thoroughly reviewed. Debridement of mycotic and hypertrophic toenails, 1 through 5 bilateral and clearing of subungual debris. No ulceration, no infection noted.  Return Visit-Office Procedure: Patient instructed to return to the office for a follow up visit 3 months for continued evaluation and treatment.

## 2014-10-20 ENCOUNTER — Telehealth: Payer: Self-pay | Admitting: Cardiology

## 2014-10-20 NOTE — Telephone Encounter (Signed)
Patients wife states that he has been short of breath and his abdomen has been swollen  Is taking lasix 40 mg a daily.  Has not taken an extra dose as ordered prn   BP today 140/60  Not able to give patients weight  Has an appointment with Nada Boozer on Thursday the 22nd of September  Advised to take an additional lasix today and to call us in the morning to let us know how he is doing and for any further instructions such as taking an additional prn lasix  Patients wife understands and will give him an additional Lasix 40 mg today and call us in the morning with an update on her husband

## 2014-10-20 NOTE — Telephone Encounter (Signed)
Pt's wife called in stating that the pt is still having increased SOB without exertion and she would like for him to be seen today if possible. She does not feel that he should wait a couple more days.   Thanks

## 2014-10-20 NOTE — Telephone Encounter (Signed)
Spoke with pt, Aware of dr crenshaw's recommendations.  °

## 2014-10-20 NOTE — Telephone Encounter (Signed)
Take additional 40 mg lasix for 3 days and then resume previous dose; check bmet one week Philip Flores

## 2014-10-20 NOTE — Telephone Encounter (Signed)
Routing to Dr. Jens Som

## 2014-10-22 ENCOUNTER — Ambulatory Visit (INDEPENDENT_AMBULATORY_CARE_PROVIDER_SITE_OTHER): Payer: PPO | Admitting: Cardiology

## 2014-10-22 ENCOUNTER — Encounter: Payer: Self-pay | Admitting: *Deleted

## 2014-10-22 ENCOUNTER — Encounter: Payer: Self-pay | Admitting: Cardiology

## 2014-10-22 VITALS — BP 162/76 | HR 56 | Ht 72.0 in | Wt 172.5 lb

## 2014-10-22 DIAGNOSIS — I5033 Acute on chronic diastolic (congestive) heart failure: Secondary | ICD-10-CM

## 2014-10-22 DIAGNOSIS — I482 Chronic atrial fibrillation, unspecified: Secondary | ICD-10-CM

## 2014-10-22 DIAGNOSIS — I2510 Atherosclerotic heart disease of native coronary artery without angina pectoris: Secondary | ICD-10-CM | POA: Diagnosis not present

## 2014-10-22 DIAGNOSIS — R0602 Shortness of breath: Secondary | ICD-10-CM | POA: Diagnosis not present

## 2014-10-22 DIAGNOSIS — I10 Essential (primary) hypertension: Secondary | ICD-10-CM

## 2014-10-22 DIAGNOSIS — I5032 Chronic diastolic (congestive) heart failure: Secondary | ICD-10-CM

## 2014-10-22 MED ORDER — FUROSEMIDE 40 MG PO TABS: ORAL_TABLET | ORAL | Status: AC

## 2014-10-22 NOTE — Progress Notes (Signed)
Cardiology Office Note   Date:  10/22/2014   ID:  Philip Mariner Sr., DOB 18-Feb-1923, MRN [id number]  PCP:  Robb Matar, MD  Cardiologist:  Dr. Jens Som    Chief Complaint  Patient presents with  . Shortness of Breath    with activity      History of Present Illness: Philip Hamre Sr. is a 81 y.o. male who presents for shortness of breath.  He had called with SOB and was instructed to take lasix an extra 40 mg for 3 days then back to normal dose of 40 mg daily.   He has a hx. Of CAD, status post CABG, small AAA, atrial fibrillation, diastolic CHF; h/o pulmonary embolus s/p IVC filter.   Studies: - LHC 3/02 following abnormal Myoview (EF 53%, moderate to severe inferior and lateral ischemia): Total occlusion of native vessels, LIMA-LAD patent, distal LAD filled well, distal LAD filled marginal branch and PDA, SVG-PDA and PL occluded, SVG-circumflex occluded, EF 50%. 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  He is better after increasing his diuretic.  But SOB occurred only with exertion, but even walking from living room to BR he would be SOB.  Pt recently lacerated 3 fingers on a table saw and fractured them.  When he was in ER he fell and had an abrasion of his arm.  He falls frequently.  He denies any chest pain.    Past Medical History  Diagnosis Date  . Hypertension   . Coronary artery disease     a. s/p redo CABG 1988; cath 3/02: LAD, CFX and RCA occluded; S-PDA + PLBr of RCA + dCFX occluded, S-CFX occluded, L-LAD ok - treated medically  . Hyperlipidemia   . TIA (transient ischemic attack)    hx of  . Atrial fibrillation     no coumadin 2/2 falls  . Mobitz type 1 second degree AV block   . Diastolic congestive heart failure     echo 2/10: Ef 50-55%, in AK, sept HK, mild Asc aorta dilataion, mild-mod BAE, mild LVH;   b. echo 4/12:  EF 60-65%, mild LVH, mod BAE, mild MR, PASP 45  . Abdominal aortic aneurysm     u/s 10/11: 3.5 x 3.5 cm;  Abdominal US 01/2012: Stable dimensions of juxtarenal AAA 3 x 3.3 cm-follow up 1 year    Past Surgical History  Procedure Laterality Date  . Coronary artery bypass graft  1977    with redo 1988  . Prostatectomy       Current Outpatient Prescriptions  Medication Sig Dispense Refill  . aspirin 81 MG tablet Take 162 mg by mouth daily.     Marland Kitchen azelastine (ASTELIN) 137 MCG/SPRAY nasal spray Place 1 spray into the nose 2 (two) times daily. Use in each nostril as directed     . Calcium Carbonate-Vitamin D (CALCIUM + D) 600-200 MG-UNIT TABS Take 1 tablet by mouth.      . cetirizine (ZYRTEC) 10 MG tablet Take 10 mg by mouth daily.      . clopidogrel (PLAVIX) 75 MG tablet Take 1 tablet (75 mg total) by mouth daily with breakfast.  30 tablet 5  . FETZIMA TITRATION 20 & 40 MG C4PK     . FLUoxetine (PROZAC) 20 MG tablet Take 20 mg by mouth daily.    . Fluticasone-Salmeterol (ADVAIR) 250-50 MCG/DOSE AEPB Inhale 1 puff into the lungs 2 (two) times daily.    . furosemide (LASIX) 40 MG tablet Take 2 tablets (80 mg total) by mouth daily as needed (if weight increases 3 lbs in one day (5 lbs in one week), swelling, shortness of breath). (Patient taking differently: Take 40 mg by mouth daily. ) 30 tablet 5  . lansoprazole (PREVACID) 30 MG capsule Take 30 mg by mouth daily.     . Multiple Vitamins-Minerals (MULTI COMPLETE PO) Take 1 tablet by mouth daily.     . potassium chloride SA (K-DUR,KLOR-CON) 20 MEQ tablet Take 1 tablet (20 mEq total) by mouth as directed. Take 20 meq twice daily for 3 days; after 3 days you will take 1 tablet daily 45 tablet 11  . PROAIR HFA  108 (90 BASE) MCG/ACT inhaler Inhale 1 puff into the lungs every 4 (four) hours as needed for wheezing. PRN    . simvastatin (ZOCOR) 80 MG tablet Take 40 mg by mouth at bedtime.     . terbinafine (LAMISIL) 1 % cream Apply 1 application topically 2 (two) times daily.    . traMADol (ULTRAM) 50 MG tablet Take 50 mg by mouth every 6 (six) hours as needed.     . vitamin B-12 (CYANOCOBALAMIN) 500 MCG tablet Take 500 mcg by mouth daily.       No current facility-administered medications for this visit.    Allergies:   Review of patient's allergies indicates no known allergies.    Social History:  The patient  reports that he has never smoked. His smokeless tobacco use includes Chew. He reports that he drinks about 0.6 oz of alcohol per week. He reports that he does not use illicit drugs.   Family History:  The patient's family history includes Diabetes in his mother; Emphysema in his father.    ROS:  General:no colds or fevers, weight down from 178 to 172  Skin:no rashes or ulcers HEENT:no blurred vision, no congestion CV:see HPI PUL:see HPI GI:no diarrhea constipation or melena, no indigestion GU:no hematuria, no dysuria MS:no joint pain, no claudication Neuro:no syncope, no lightheadedness Endo:no diabetes, no thyroid disease  Wt Readings from Last 3 Encounters:  10/22/14 172 lb 8 oz (78.245 kg)  06/30/14 178 lb (80.74 kg)  11/11/13 176 lb (79.833 kg)     PHYSICAL EXAM: VS:  BP 162/76 mmHg  Pulse 56  Ht 6' (1.829 m)  Wt 172 lb 8 oz (78.245 kg)  BMI 23.39 kg/m2 , BMI Body mass index is 23.39 kg/(m^2). General:Pleasant affect, NAD Skin:Warm and dry, brisk capillary refill HEENT:normocephalic, sclera clear, mucus membranes moist Neck:supple, no JVD, no bruits  Heart:S1S2 RRR with 1-2/6 systolic murmur, no gallup, rub or click Lungs:clear without rales, rhonchi, or wheezes ZOX:WRUE, non tender, + BS, do not palpate liver spleen or masses Ext:no lower ext edema, 2+ pedal pulses,  2+ radial pulses, rt fingers with splints Neuro:alert and oriented X 3, MAE, follows commands, + facial symmetry    EKG:  EKG is ordered today. The ekg ordered today demonstrates a fib with slow ventricular response no acute changes from previous.     Recent Labs: 07/02/2014: BUN 14; Creatinine, Ser 0.76; Potassium 4.2; Sodium 130*    Lipid Panel    Component Value Date/Time  CHOL 106 03/10/2008 1103   TRIG 71 03/10/2008 1103   HDL 38.5* 03/10/2008 1103   CHOLHDL 2.8 CALC 03/10/2008 1103   VLDL 14 03/10/2008 1103   LDLCALC 53 03/10/2008 1103       Other studies Reviewed: Additional studies/ records that were reviewed today include: previous studies and notes.   ASSESSMENT AND PLAN:  1.  Acute on chronic diastolic HF- improved with increased lasix, will check labs today and plan for echo.  He has no pain, though it may be ischemia causing his DOE.  I adjusted lasix to 40 mg daily except 2 tabs on Mondays and Thursdays.  He will follow up with Dr. Jens Som  At first available unless ECHO with acute changes and will need to be evaluated earlier.  He will call if treatment plan does not control SOB.  He has a hx of dyspnea but this had increased, he does not limit salt intake which we discussed.   2. CAD with hx CABG. No pain but if EF decreased will check with Dr. Velta Addison for nuc. On ASA and statin  3. Small AAA- recent abd u/s stable to recheck in 2 years.  4. Chronic a fib- no anticoagulation due to freq falls.       5. HTN elevated today, but with increase of diuretic will follow, hx of hypotension in past.    Current medicines are reviewed with the patient today.  The patient Has no concerns regarding medicines.  The following changes have been made:  See above Labs/ tests ordered today include:see above  Disposition:   FU:  see above  Signed, Leone Brand, NP  10/22/2014 1:54 PM    [facility] [address] [place], [place], [place]   [address]/ [address] [place], [place] Phone: [phone]; Fax: [phone]  [phone]

## 2014-10-22 NOTE — Patient Instructions (Signed)
Your physician recommends that you schedule a follow-up appointment in: DR CRENSHAW AS SCHEDULED  Your physician has requested that you have an echocardiogram. Echocardiography is a painless test that uses sound waves to create images of your heart. It provides your doctor with information about the size and shape of your heart and how well your heart's chambers and valves are working. This procedure takes approximately one hour. There are no restrictions for this procedure.   Your physician recommends that you HAVE LAB WORK TODAY  TAKE 40 MG OF FUROSEMIDE DAILY EXCEPT TAKE 2 TABLETS EVERY Monday AND THURSDAY

## 2014-10-23 LAB — BASIC METABOLIC PANEL
BUN: 14 mg/dL (ref 7–25)
CHLORIDE: 90 mmol/L — AB (ref 98–110)
CO2: 27 mmol/L (ref 20–31)
Calcium: 9.6 mg/dL (ref 8.6–10.3)
Creat: 0.66 mg/dL — ABNORMAL LOW (ref 0.70–1.11)
GLUCOSE: 94 mg/dL (ref 65–99)
Potassium: 4.8 mmol/L (ref 3.5–5.3)
SODIUM: 132 mmol/L — AB (ref 135–146)

## 2014-11-02 ENCOUNTER — Other Ambulatory Visit (HOSPITAL_COMMUNITY): Payer: PPO

## 2014-11-05 IMAGING — CR DG CHEST 2V
2 series · 2 of 2 positions shown · non-contrast
Comparison: Chest x-ray and chest CT dated 05/12/2013

CLINICAL DATA: Hypotension.  Weight loss.

EXAM:
CHEST  2 VIEW

[w chest pa]
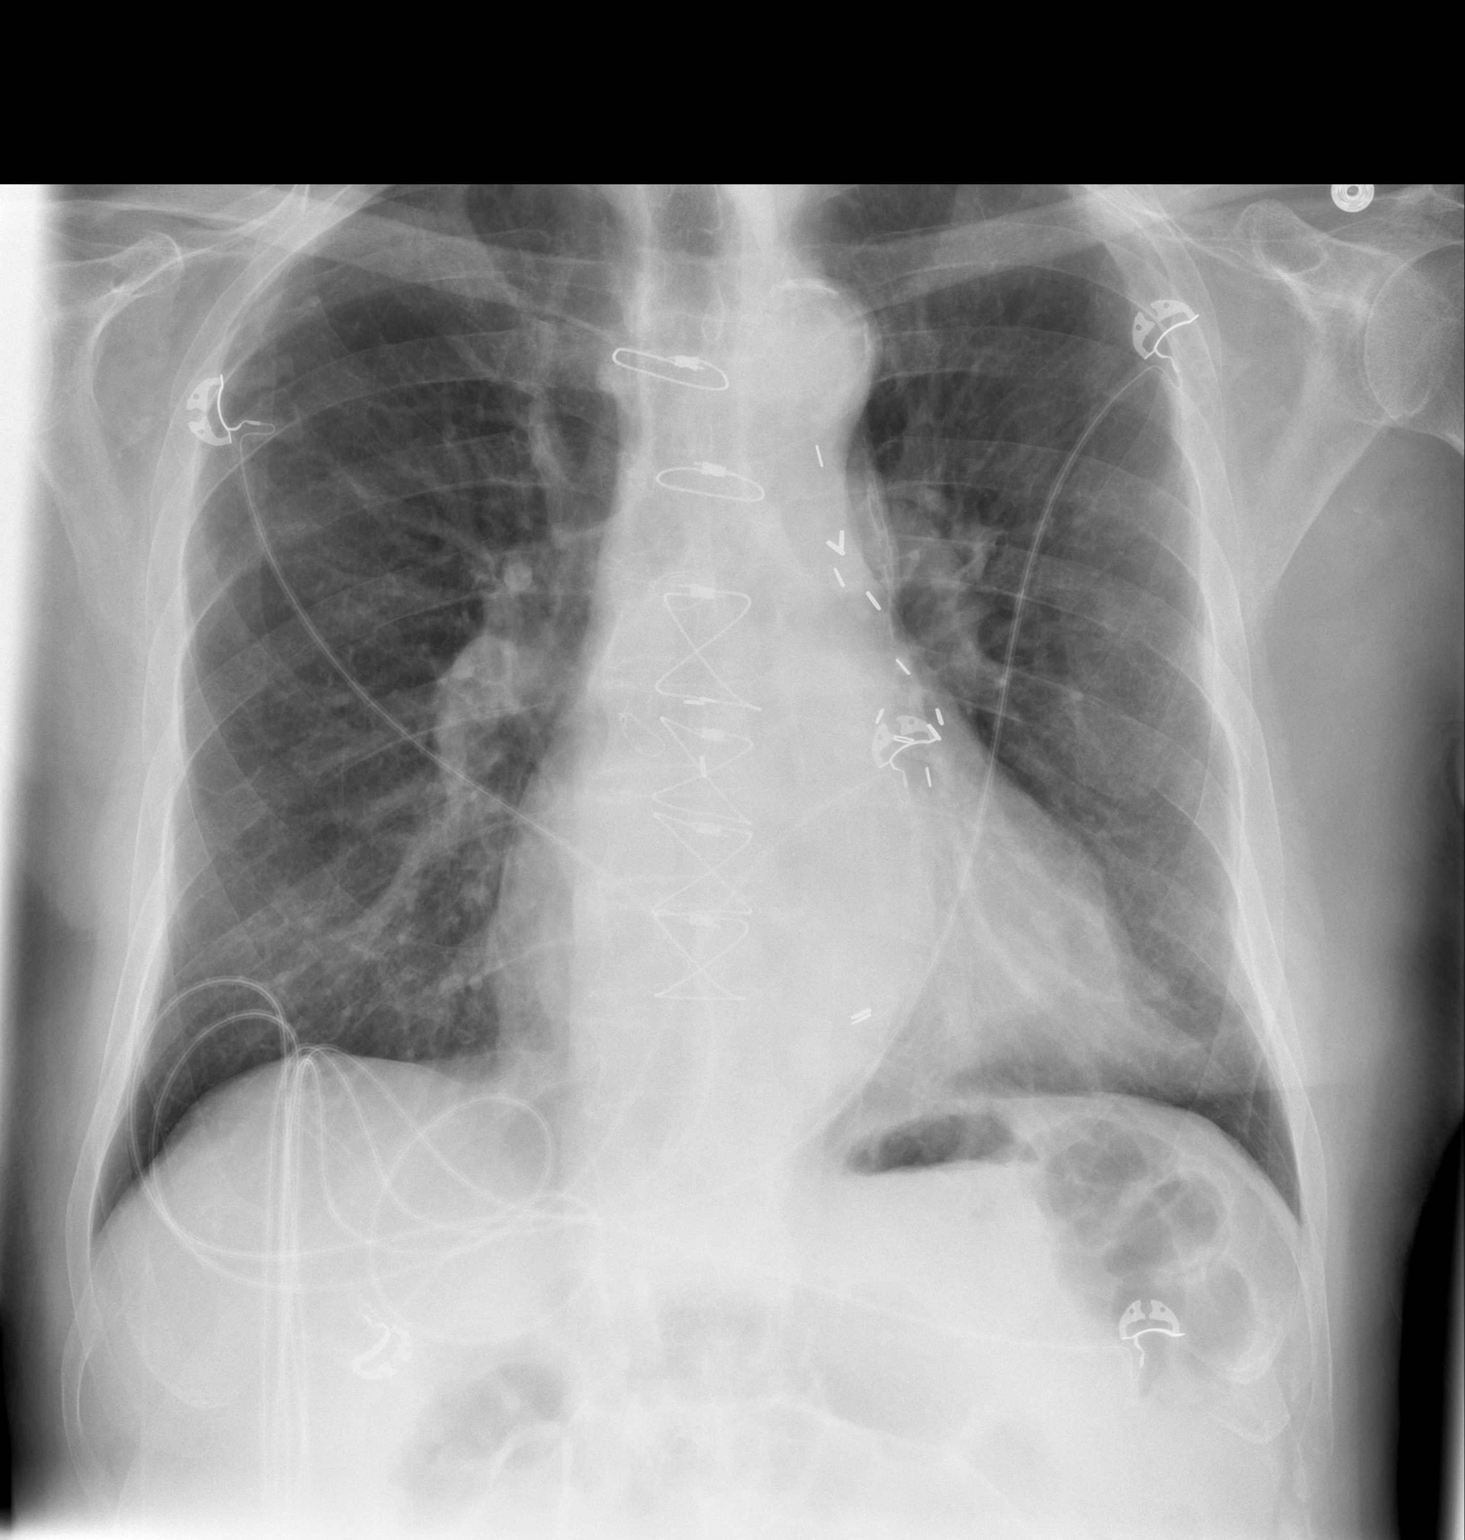

[w chest lat]
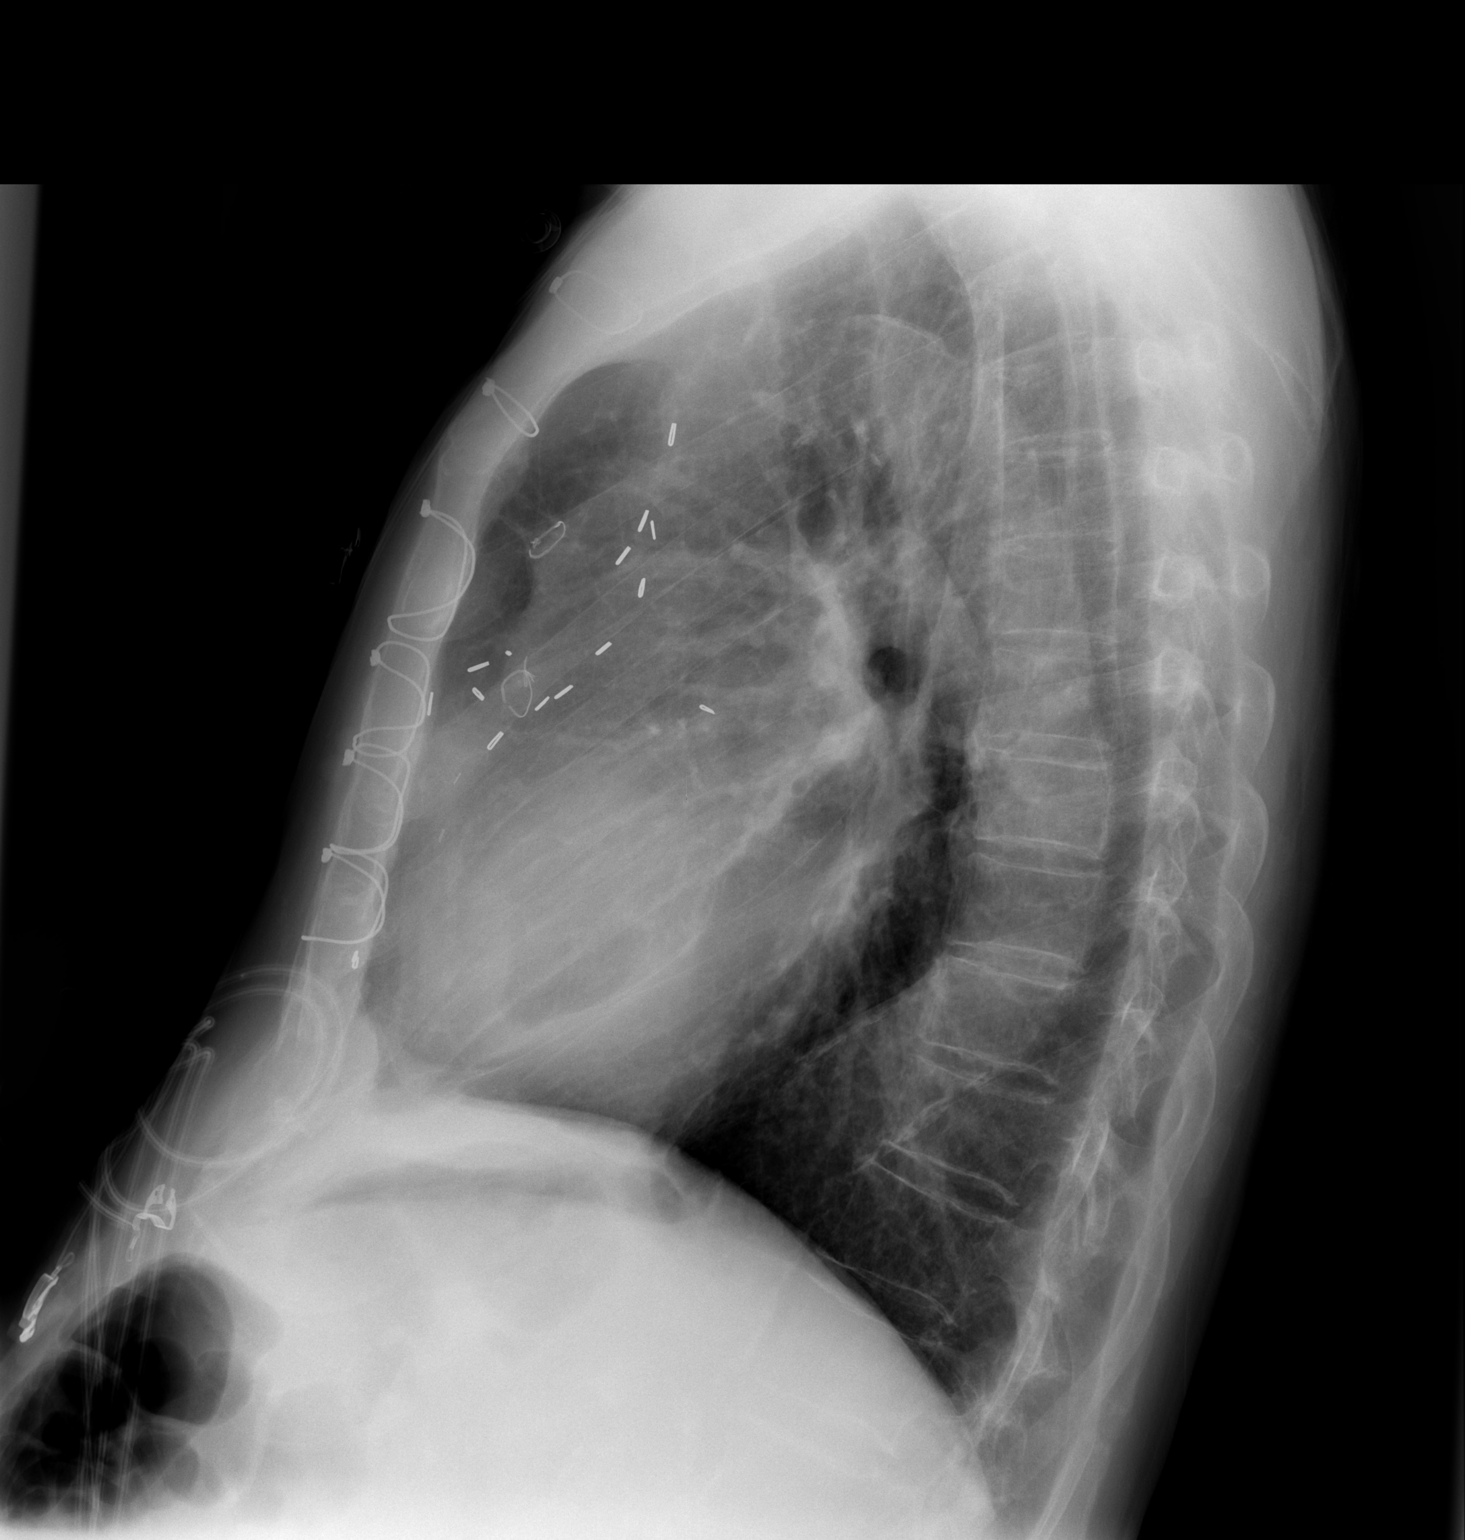

[2 of 2 positions shown; findings below may reference images not displayed]

FINDINGS: Heart size and pulmonary vascularity are at the upper limits of
normal. There is tortuosity and calcification of the thoracic aorta.
Prior CABG.

The lungs are clear.  No effusions.  No acute osseous abnormality.
IMPRESSION: No acute abnormalities.

## 2014-11-12 ENCOUNTER — Telehealth (HOSPITAL_COMMUNITY): Payer: Self-pay | Admitting: *Deleted

## 2014-12-01 DEATH — deceased

## 2014-12-30 ENCOUNTER — Ambulatory Visit: Payer: PPO | Admitting: Cardiology
